# Patient Record
Sex: Male | Born: 1950
Health system: Southern US, Community
[De-identification: ages and names within clinical notes are randomized; demographics above are authoritative.]

## PROBLEM LIST (undated history)

## (undated) DIAGNOSIS — R0683 Snoring: Secondary | ICD-10-CM

## (undated) DIAGNOSIS — Z86010 Personal history of colonic polyps: Secondary | ICD-10-CM

## (undated) DIAGNOSIS — K219 Gastro-esophageal reflux disease without esophagitis: Secondary | ICD-10-CM

## (undated) DIAGNOSIS — Z860101 Personal history of adenomatous and serrated colon polyps: Secondary | ICD-10-CM

## (undated) DIAGNOSIS — Z87442 Personal history of urinary calculi: Secondary | ICD-10-CM

## (undated) DIAGNOSIS — E785 Hyperlipidemia, unspecified: Secondary | ICD-10-CM

## (undated) DIAGNOSIS — N471 Phimosis: Secondary | ICD-10-CM

## (undated) DIAGNOSIS — Z8709 Personal history of other diseases of the respiratory system: Secondary | ICD-10-CM

## (undated) DIAGNOSIS — Z973 Presence of spectacles and contact lenses: Secondary | ICD-10-CM

## (undated) DIAGNOSIS — B009 Herpesviral infection, unspecified: Secondary | ICD-10-CM

## (undated) HISTORY — PX: COLONOSCOPY: SHX174

## (undated) HISTORY — PX: INGUINAL HERNIA REPAIR: SUR1180

---

## 1990-11-02 HISTORY — PX: ARTHROSCOPIC REPAIR ACL: SUR80

## 2003-02-28 ENCOUNTER — Ambulatory Visit (HOSPITAL_COMMUNITY): Admission: RE | Admit: 2003-02-28 | Discharge: 2003-02-28 | Payer: Self-pay | Admitting: *Deleted

## 2003-02-28 ENCOUNTER — Encounter (INDEPENDENT_AMBULATORY_CARE_PROVIDER_SITE_OTHER): Payer: Self-pay | Admitting: Specialist

## 2004-03-03 ENCOUNTER — Ambulatory Visit (HOSPITAL_COMMUNITY): Admission: RE | Admit: 2004-03-03 | Discharge: 2004-03-03 | Payer: Self-pay | Admitting: Anesthesiology

## 2006-04-14 ENCOUNTER — Ambulatory Visit (HOSPITAL_COMMUNITY): Admission: RE | Admit: 2006-04-14 | Discharge: 2006-04-14 | Payer: Self-pay | Admitting: Orthopaedic Surgery

## 2006-04-22 ENCOUNTER — Ambulatory Visit (HOSPITAL_COMMUNITY): Admission: RE | Admit: 2006-04-22 | Discharge: 2006-04-22 | Payer: Self-pay | Admitting: Orthopaedic Surgery

## 2006-04-22 HISTORY — PX: KNEE ARTHROSCOPY: SHX127

## 2008-05-16 ENCOUNTER — Ambulatory Visit (HOSPITAL_COMMUNITY): Admission: RE | Admit: 2008-05-16 | Discharge: 2008-05-16 | Payer: Self-pay | Admitting: Neurological Surgery

## 2008-05-29 ENCOUNTER — Ambulatory Visit (HOSPITAL_COMMUNITY): Admission: RE | Admit: 2008-05-29 | Discharge: 2008-05-29 | Payer: Self-pay | Admitting: Neurological Surgery

## 2010-03-06 ENCOUNTER — Emergency Department (HOSPITAL_COMMUNITY): Admission: EM | Admit: 2010-03-06 | Discharge: 2010-03-06 | Payer: Self-pay | Admitting: Family Medicine

## 2011-03-20 NOTE — Op Note (Signed)
[name], [name]                ACCOUNT NO.:  [id number]   MEDICAL RECORD NO.:  [id number]          PATIENT TYPE:  AMB   LOCATION:  SDS                          FACILITY:  [facility]   PHYSICIAN:  Lubertha Basque. Dalldorf, M.D.DATE OF BIRTH:  1951-04-26   DATE OF PROCEDURE:  04/22/2006  DATE OF DISCHARGE:                                 OPERATIVE REPORT   PREOPERATIVE DIAGNOSIS:  1.  Left knee chondromalacia.  2.  Left knee torn medial meniscus.   POSTOPERATIVE DIAGNOSIS:  1.  Left knee chondromalacia.  2.  Left knee torn medial and torn lateral menisci.   PROCEDURES:  1.  Left knee arthroscopic partial medial and partial lateral      meniscectomies.  2.  Left knee arthroscopic chondroplasty and abrasion medial and      patellofemoral.   ANESTHESIA:  Knee block and MAC.   ATTENDING SURGEON:  Lubertha Basque. Jerl Santos, M.D.   ASSISTANT:  [name], P.A.-C.   INDICATIONS FOR PROCEDURE:  The patient is a 60 year old man who is many  years out from an ACL reconstruction.  Over recent years, he has had  increasing pain which has gotten much worse over the last couple of months.  This has become resistant to oral anti-inflammatories and even an injection  which helped for about a day or two.  He has pain which limits his ability  to rest and to walk and squat and do his job.  He had undergone a  preoperative MRI scan which shows some degenerative changes also something  suspicious for meniscus tear.  He also has a large area of scar tissue in  the anterior portion of the knee.  He is offered an arthroscopy.  Informed  operative consent was obtained after a discussion about the possible  complications of reaction to anesthesia and infection.   SUMMARY AND FINDINGS:  Under knee block and MAC through two portals, an  arthroscopy of the left knee was performed.  In the suprapatellar pouch, he  had some articular loose bodies which were removed.  The patellofemoral  joint tracked well but he  had grade 3 and 4 change on both aspects.  A  thorough chondroplasty was done of both the patella and intertrochlear  groove with abrasion to bleeding bone of some small areas of the  intertrochlear groove.  In the medial compartment, he had a small posterior  horn medial meniscus tear addressed with about a 5% partial meniscectomy  back to stable tissues.  Of greater significance was an area about the size  of two quarters which was devoid of articular cartilage.  This was on the  medial femoral condyle on the weight bearing portion.  He had some large  flaps of articular cartilage which I addressed with a chondroplasty and then  did an abrasion to bleeding bone in some areas but not a formal  microfracture as the area seemed too large.  In the lateral compartment, he  had no articular cartilage damage but did have a displaced lateral meniscus  tear which was flipped into the notch.  About a  20% partial lateral  meniscectomy was done with the shaver and basket.  In the notch, he had a  large ball of scar tissue stuck in the anterior position consistent with a  cyclops lesion and this was removed revealing an intact ACL reconstruction  underneath.   DESCRIPTION OF PROCEDURE:  The patient was taken to the operating suite  where knee block and MAC were applied.  He was positioned supine and prepped  and draped in a normal sterile fashion.  After the administration of IV  Kefzol, an arthroscopy of the left knee was performed through two portals.  Findings were as noted above.  The procedure consisted of a 5% partial  medial meniscectomy and about a 20% lateral meniscectomy.  As mentioned  above, we performed chondroplasty in the medial and patellofemoral  compartments with abrasion to bleeding bone in several small areas.  An  abundance of interarticular loose bodies were removed most of which seemed  to be articular cartilage in nature.  The large cyclops lesion was removed  revealing an  intact ACL underneath.  The knee was thoroughly irrigated at  the end of the case followed by placement of Marcaine with epinephrine and  morphine plus 80 mg Depo-Medrol.  Adaptic was applied to the portals  followed by dry gauze and loose Ace wrap.  Estimated blood loss and  interoperative fluids can be obtained from anesthesia records.   DISPOSITION:  The patient was taken to the recovery room in stable addition.   PLAN:  He was to go home same day and to follow up in the office in a week.  I will contact him by phone tonight.      Lubertha Basque Jerl Santos, M.D.  Electronically Signed     PGD/MEDQ  D:  04/22/2006  T:  04/22/2006  Job:  [id number]

## 2011-03-20 NOTE — Op Note (Signed)
   NAME:  [name], [name]                          ACCOUNT NO.:  [id number]   MEDICAL RECORD NO.:  [id number]                   PATIENT TYPE:  AMB   LOCATION:  ENDO                                 FACILITY:  [facility]   PHYSICIAN:  Georgiana Spinner, M.D.                 DATE OF BIRTH:  06-11-1951   DATE OF PROCEDURE:  02/28/2003  DATE OF DISCHARGE:                                 OPERATIVE REPORT   PROCEDURE:  Colonoscopy.   INDICATIONS:  Colon polyps.   ANESTHESIA:  Fentanyl 50 mcg, Versed 2.5 mg, propofol 300 mg.   DESCRIPTION OF PROCEDURE:  With the patient mildly sedated in the left  lateral decubitus position, the Olympus videoscopic colonoscope was inserted  in the rectum and passed under direct vision to the cecum, identified by  ileocecal valve and appendiceal orifice.  We entered into the terminal  ileum, which also appeared normal and was photographed.  From this point the  colonoscope was slowly withdrawn, taking circumferential views of the entire  colonic mucosa, stopping in the rectum where a small polyp was seen,  photographed, and removed using hot biopsy forceps technique, setting of 20-  20 blended current.  Just distal to this in the rectum was seen a larger,  approximately 1 cm lobulated polyp.  It too was photographed, and it was  removed using snare cautery technique, setting again of 20-20 blended  current.  The residual area was touched with a probe and electric current  given to cauterize any residual tissue.  The endoscope was placed in  retroflexion to view the anal canal from above.  Hemorrhoids were seen.  The  endoscope was straightened and withdrawn.  The patient's vital signs and  pulse oximetry remained stable.  The patient tolerated the procedure well  without apparent complications.   FINDINGS:  1. Polyps of rectum as described above, removed.  Await biopsy report.  The     patient will call me for results and follow up with me as an outpatient.  2.  Internal hemorrhoids were seen as well.                                               Georgiana Spinner, M.D.    GMO/MEDQ  D:  02/28/2003  T:  02/28/2003  Job:  [id number]

## 2011-05-29 ENCOUNTER — Other Ambulatory Visit: Payer: Self-pay | Admitting: Gastroenterology

## 2012-01-27 ENCOUNTER — Encounter (HOSPITAL_COMMUNITY): Payer: Self-pay | Admitting: *Deleted

## 2012-01-27 ENCOUNTER — Emergency Department (INDEPENDENT_AMBULATORY_CARE_PROVIDER_SITE_OTHER)
Admission: EM | Admit: 2012-01-27 | Discharge: 2012-01-27 | Disposition: A | Discharge status: Home / Self Care | Payer: 59 | Source: Home / Self Care | Attending: Family Medicine | Admitting: Family Medicine

## 2012-01-27 DIAGNOSIS — R059 Cough, unspecified: Secondary | ICD-10-CM

## 2012-01-27 DIAGNOSIS — R05 Cough: Secondary | ICD-10-CM

## 2012-01-27 LAB — POCT RAPID STREP A: Streptococcus, Group A Screen (Direct): NEGATIVE

## 2012-01-27 MED ORDER — OMEPRAZOLE 20 MG PO CPDR: DELAYED_RELEASE_CAPSULE | ORAL | Status: DC

## 2012-01-27 MED ORDER — PREDNISONE 20 MG PO TABS: ORAL_TABLET | ORAL | Status: AC

## 2012-01-27 NOTE — Discharge Instructions (Signed)
Persistent cough can be a sign of different conditions (see handout for more information ) My impression is that you likely have gastroesophageal reflux versus seasonal allergies or both causing irritation and swelling of your upper airways. Which can explain your symptoms of cough, voice hoarseness, stridor etc. Can follow antireflux measures like elevating the head side of your bed and avoiding spicy food and caffeine etc. Take the prescribed medications as instructed. Use steam water (vaporizer) overnight at bedside. Can take over-the-counter Zyrtec 10 mg or Allegra 180 mg daily. Followup with your primary care provider if persistent symptoms as you may need to be referred to a lung specia for lung functional test. Return or go to the emergency department if worsening symptoms like difficulty breathing new onset of fever or any other concern.

## 2012-01-27 NOTE — ED Provider Notes (Signed)
History     CSN: [id number]  Arrival date & time 01/27/12  1605   First MD Initiated Contact with Patient 01/27/12 1711      Chief Complaint  Patient presents with  . Cough    (Consider location/radiation/quality/duration/timing/severity/associated sxs/prior treatment) HPI Comments: 61 y/o male non smoker no significant PMH here c/o cough non minimally productive of clear sputum for 10 days. Describes a tickle sensation in upper airways when laying flat which  triggers the cough spells. Cough is not always productive. Denies chest pain or shortness of breath but has noticed inspiratory stridor sporadically. No headache, fever, chills or malaise. No sore throat, nasal congestion or rhinorrhea. Denies prior cold symptoms. 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 No known acid reflux. No prior history of asthma but has used a inhaler with some symptoms relief. Denies PND, leg swelling. Mild hoarseness.    Past Medical History  Diagnosis Date  . Bronchitis     Past Surgical History  Procedure Date  . Knee surgery     History reviewed. No pertinent family history.  History  Substance Use Topics  . Smoking status: Never Smoker   . Smokeless tobacco: Not on file  . Alcohol Use: No      Review of Systems  Constitutional: Negative for fever, chills, appetite change, fatigue and unexpected weight change.  HENT: Negative for ear pain, congestion, sore throat, rhinorrhea, trouble swallowing and sinus pressure.   Eyes: Negative for discharge and itching.  Respiratory: Positive for cough and stridor. Negative for chest tightness, shortness of breath and wheezing.   Cardiovascular: Negative for chest pain, palpitations and leg swelling.  Gastrointestinal: Negative for nausea, vomiting, abdominal pain and diarrhea.  Musculoskeletal: Negative for myalgias and arthralgias.  Skin: Negative for rash.  Neurological: Negative for dizziness and headaches.    Allergies   Codeine  Home Medications   Current Outpatient Rx  Name Route Sig Dispense Refill  . ALBUTEROL SULFATE HFA 108 (90 BASE) MCG/ACT IN AERS Inhalation Inhale 2 puffs into the lungs every 6 (six) hours as needed.    Marland Kitchen OVER THE COUNTER MEDICATION  Cough medication    . OMEPRAZOLE 20 MG PO CPDR  Take 2 tabs bid for 7 days then 1 tab po daily 30 capsule 0  . PREDNISONE 20 MG PO TABS  2 tabs po daily for 5 days 10 tablet no    BP 131/89  Pulse 64  Temp(Src) 98.5 F (36.9 C) (Oral)  Resp 16  SpO2 97%  Physical Exam  Nursing note and vitals reviewed. Constitutional: He is oriented to person, place, and time. He appears well-developed and well-nourished. No distress.       overweight.   HENT:  Head: Normocephalic and atraumatic.  Right Ear: External ear normal.  Left Ear: External ear normal.  Nose: Nose normal.  Mouth/Throat: Oropharynx is clear and moist. No oropharyngeal exudate.       Mild pharyngeal e erythema with no postnasal drip or exudates.  Eyes: Conjunctivae are normal. Right eye exhibits no discharge. Left eye exhibits no discharge.  Neck: Neck supple. No JVD present. No thyromegaly present.  Cardiovascular: Normal rate, regular rhythm and normal heart sounds.  Exam reveals no gallop and no friction rub.   No murmur heard. Pulmonary/Chest: Effort normal and breath sounds normal. No respiratory distress. He has no wheezes. He has no rales. He exhibits no tenderness.       Impress sporadic mild upper airways inspiratory stridor  and mild hoarseness.  Lymphadenopathy:    He has no cervical adenopathy.  Neurological: He is alert and oriented to person, place, and time.  Skin: No rash noted. He is not diaphoretic.    ED Course  Procedures (including critical care time)   Labs Reviewed  POCT RAPID STREP A ([facility] CARE ONLY)  LAB REPORT - SCANNED   No results found.   1. Cough       MDM  Negative strep. Impress irritation of upper airways possible non infectious  laryngotracheitis likely related to seasonal allergies or exposure to dry cold air in OR. Possible GERD contributing. Prescribed prednisone for 5 days. Asked to take over the counter zyrtec or allegra, saline spray,  Prilosec 40mg  bid for 1 week then take 20mg  daily. Antireflux measures and humid air (vaporizer) overnight. continue inhaler.         Sharin Grave, MD 01/28/12 1210

## 2012-01-27 NOTE — ED Notes (Signed)
Pt with c/o dry cough x 10 days - unable to sleep at night due to coughing when lying flat

## 2012-01-27 NOTE — ED Notes (Signed)
Walked pt in unit spo2 96 to 97%

## 2012-08-15 ENCOUNTER — Encounter (HOSPITAL_BASED_OUTPATIENT_CLINIC_OR_DEPARTMENT_OTHER): Payer: Self-pay | Admitting: *Deleted

## 2012-08-15 NOTE — Progress Notes (Signed)
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

## 2012-08-19 ENCOUNTER — Ambulatory Visit (HOSPITAL_BASED_OUTPATIENT_CLINIC_OR_DEPARTMENT_OTHER): Payer: 59 | Admitting: Anesthesiology

## 2012-08-19 ENCOUNTER — Encounter (HOSPITAL_BASED_OUTPATIENT_CLINIC_OR_DEPARTMENT_OTHER): Payer: Self-pay

## 2012-08-19 ENCOUNTER — Encounter (HOSPITAL_BASED_OUTPATIENT_CLINIC_OR_DEPARTMENT_OTHER): Payer: Self-pay | Admitting: Anesthesiology

## 2012-08-19 ENCOUNTER — Encounter (HOSPITAL_BASED_OUTPATIENT_CLINIC_OR_DEPARTMENT_OTHER)
Admission: RE | Disposition: A | Discharge status: Home / Self Care | Payer: Self-pay | Source: Ambulatory Visit | Attending: Otolaryngology

## 2012-08-19 ENCOUNTER — Ambulatory Visit (HOSPITAL_BASED_OUTPATIENT_CLINIC_OR_DEPARTMENT_OTHER)
Admission: RE | Admit: 2012-08-19 | Discharge: 2012-08-19 | Disposition: A | Discharge status: Home / Self Care | Payer: 59 | Source: Ambulatory Visit | Attending: Otolaryngology | Admitting: Otolaryngology

## 2012-08-19 DIAGNOSIS — L723 Sebaceous cyst: Secondary | ICD-10-CM | POA: Insufficient documentation

## 2012-08-19 DIAGNOSIS — E785 Hyperlipidemia, unspecified: Secondary | ICD-10-CM | POA: Insufficient documentation

## 2012-08-19 DIAGNOSIS — L089 Local infection of the skin and subcutaneous tissue, unspecified: Secondary | ICD-10-CM | POA: Diagnosis present

## 2012-08-19 HISTORY — PX: EAR CYST EXCISION: SHX22

## 2012-08-19 SURGERY — CYST REMOVAL
Anesthesia: General | Site: Ear | Laterality: Left | Wound class: Clean

## 2012-08-19 MED ORDER — OXYCODONE HCL 5 MG/5ML PO SOLN: 5.0000 mg | Freq: Once | ORAL | Status: DC | PRN

## 2012-08-19 MED ORDER — OXYCODONE HCL 5 MG PO TABS: 5.0000 mg | ORAL_TABLET | Freq: Once | ORAL | Status: DC | PRN

## 2012-08-19 MED ORDER — MIDAZOLAM HCL 5 MG/5ML IJ SOLN
INTRAMUSCULAR | Status: DC | PRN
  Administered 2012-08-19: 2 mg via INTRAVENOUS

## 2012-08-19 MED ORDER — LIDOCAINE-EPINEPHRINE 1 %-1:100000 IJ SOLN
INTRAMUSCULAR | Status: DC | PRN
  Administered 2012-08-19: 1 mL

## 2012-08-19 MED ORDER — LACTATED RINGERS IV SOLN
INTRAVENOUS | Status: DC
  Administered 2012-08-19 (×2): via INTRAVENOUS

## 2012-08-19 MED ORDER — PROMETHAZINE HCL 25 MG/ML IJ SOLN: 6.2500 mg | INTRAMUSCULAR | Status: DC | PRN

## 2012-08-19 MED ORDER — MIDAZOLAM HCL 2 MG/2ML IJ SOLN: 0.5000 mg | Freq: Once | INTRAMUSCULAR | Status: DC | PRN

## 2012-08-19 MED ORDER — DEXAMETHASONE SODIUM PHOSPHATE 4 MG/ML IJ SOLN
INTRAMUSCULAR | Status: DC | PRN
  Administered 2012-08-19: 10 mg via INTRAVENOUS

## 2012-08-19 MED ORDER — MEPERIDINE HCL 25 MG/ML IJ SOLN: 6.2500 mg | INTRAMUSCULAR | Status: DC | PRN

## 2012-08-19 MED ORDER — LIDOCAINE HCL (CARDIAC) 20 MG/ML IV SOLN
INTRAVENOUS | Status: DC | PRN
  Administered 2012-08-19: 30 mg via INTRAVENOUS

## 2012-08-19 MED ORDER — FENTANYL CITRATE 0.05 MG/ML IJ SOLN
INTRAMUSCULAR | Status: DC | PRN
  Administered 2012-08-19: 100 ug via INTRAVENOUS

## 2012-08-19 MED ORDER — AMOXICILLIN-POT CLAVULANATE 500-125 MG PO TABS: 1.0000 | ORAL_TABLET | Freq: Two times a day (BID) | ORAL | Status: DC

## 2012-08-19 MED ORDER — FENTANYL CITRATE 0.05 MG/ML IJ SOLN: 25.0000 ug | INTRAMUSCULAR | Status: DC | PRN

## 2012-08-19 MED ORDER — CEFAZOLIN SODIUM-DEXTROSE 2-3 GM-% IV SOLR
INTRAVENOUS | Status: DC | PRN
  Administered 2012-08-19: 2 g via INTRAVENOUS

## 2012-08-19 MED ORDER — PROPOFOL 10 MG/ML IV BOLUS
INTRAVENOUS | Status: DC | PRN
  Administered 2012-08-19: 200 mg via INTRAVENOUS

## 2012-08-19 MED ORDER — ONDANSETRON HCL 4 MG/2ML IJ SOLN
INTRAMUSCULAR | Status: DC | PRN
  Administered 2012-08-19: 4 mg via INTRAVENOUS

## 2012-08-19 SURGICAL SUPPLY — 54 items
ADH SKN CLS APL DERMABOND .7 (GAUZE/BANDAGES/DRESSINGS) ×1
APL SKNCLS STERI-STRIP NONHPOA (GAUZE/BANDAGES/DRESSINGS)
ATTRACTOMAT 16X20 MAGNETIC DRP (DRAPES) IMPLANT
BENZOIN TINCTURE PRP APPL 2/3 (GAUZE/BANDAGES/DRESSINGS) IMPLANT
BLADE SURG 15 STRL LF DISP TIS (BLADE) ×1 IMPLANT
BLADE SURG 15 STRL SS (BLADE) ×2
CANISTER SUCTION 1200CC (MISCELLANEOUS) ×2 IMPLANT
CLEANER CAUTERY TIP 5X5 PAD (MISCELLANEOUS) IMPLANT
CLOTH BEACON ORANGE TIMEOUT ST (SAFETY) ×2 IMPLANT
CORDS BIPOLAR (ELECTRODE) IMPLANT
COVER MAYO STAND STRL (DRAPES) ×2 IMPLANT
COVER TABLE BACK 60X90 (DRAPES) ×2 IMPLANT
DECANTER SPIKE VIAL GLASS SM (MISCELLANEOUS) IMPLANT
DERMABOND ADVANCED (GAUZE/BANDAGES/DRESSINGS) ×1
DERMABOND ADVANCED .7 DNX12 (GAUZE/BANDAGES/DRESSINGS) IMPLANT
DRAIN PENROSE 1/4X12 LTX STRL (WOUND CARE) IMPLANT
DRAPE U-SHAPE 76X120 STRL (DRAPES) ×2 IMPLANT
ELECT COATED BLADE 2.86 ST (ELECTRODE) ×2 IMPLANT
ELECT NDL BLADE 2-5/6 (NEEDLE) IMPLANT
ELECT NEEDLE BLADE 2-5/6 (NEEDLE) ×2 IMPLANT
ELECT REM PT RETURN 9FT ADLT (ELECTROSURGICAL) ×2
ELECTRODE REM PT RTRN 9FT ADLT (ELECTROSURGICAL) ×1 IMPLANT
GAUZE SPONGE 4X4 12PLY STRL LF (GAUZE/BANDAGES/DRESSINGS) IMPLANT
GAUZE SPONGE 4X4 16PLY XRAY LF (GAUZE/BANDAGES/DRESSINGS) IMPLANT
GLOVE BIO SURGEON STRL SZ 6.5 (GLOVE) ×1 IMPLANT
GLOVE BIOGEL M 7.0 STRL (GLOVE) ×2 IMPLANT
GLOVE SKINSENSE NS SZ7.0 (GLOVE) ×1
GLOVE SKINSENSE STRL SZ7.0 (GLOVE) IMPLANT
GOWN PREVENTION PLUS XLARGE (GOWN DISPOSABLE) ×2 IMPLANT
NEEDLE 27GAX1X1/2 (NEEDLE) ×2 IMPLANT
NS IRRIG 1000ML POUR BTL (IV SOLUTION) ×2 IMPLANT
PACK BASIN DAY SURGERY FS (CUSTOM PROCEDURE TRAY) ×2 IMPLANT
PAD CLEANER CAUTERY TIP 5X5 (MISCELLANEOUS)
PENCIL BUTTON HOLSTER BLD 10FT (ELECTRODE) ×2 IMPLANT
RUBBERBAND STERILE (MISCELLANEOUS) ×1 IMPLANT
SHEET MEDIUM DRAPE 40X70 STRL (DRAPES) IMPLANT
SPONGE GAUZE 2X2 8PLY STRL LF (GAUZE/BANDAGES/DRESSINGS) ×2 IMPLANT
STRIP CLOSURE SKIN 1/4X4 (GAUZE/BANDAGES/DRESSINGS) IMPLANT
SUT ETHILON 4 0 PS 2 18 (SUTURE) ×1 IMPLANT
SUT ETHILON 5 0 P 3 18 (SUTURE)
SUT NOVAFIL 5 0 BLK 18 IN P13 (SUTURE) IMPLANT
SUT NYLON ETHILON 5-0 P-3 1X18 (SUTURE) IMPLANT
SUT VIC AB 3-0 FS2 27 (SUTURE) IMPLANT
SUT VIC AB 4-0 RB1 27 (SUTURE)
SUT VIC AB 4-0 RB1 27X BRD (SUTURE) IMPLANT
SUT VICRYL 4-0 PS2 18IN ABS (SUTURE) ×1 IMPLANT
SWAB CULTURE LIQ STUART DBL (MISCELLANEOUS) IMPLANT
SYR BULB 3OZ (MISCELLANEOUS) ×1 IMPLANT
SYR CONTROL 10ML LL (SYRINGE) ×2 IMPLANT
TOWEL OR 17X24 6PK STRL BLUE (TOWEL DISPOSABLE) ×3 IMPLANT
TRAY DSU PREP LF (CUSTOM PROCEDURE TRAY) ×2 IMPLANT
TUBE ANAEROBIC SPECIMEN COL (MISCELLANEOUS) IMPLANT
TUBE CONNECTING 20X1/4 (TUBING) ×2 IMPLANT
WATER STERILE IRR 1000ML POUR (IV SOLUTION) ×1 IMPLANT

## 2012-08-19 NOTE — Brief Op Note (Signed)
08/19/2012  8:39 AM  PATIENT:  [name]  61 y.o. male  PRE-OPERATIVE DIAGNOSIS:  infra auricular cyst  POST-OPERATIVE DIAGNOSIS:  infra auricular cyst  PROCEDURE:  Procedure(s) (LRB) with comments: CYST REMOVAL (Left) - excision of left  infra auricular cyst   SURGEON:  Surgeon(s) and Role:    * Osborn Coho, MD - Primary  PHYSICIAN ASSISTANT:   ASSISTANTS: none   ANESTHESIA:   general  EBL:  Total I/O In: 1000 [I.V.:1000] Out: - Min  BLOOD ADMINISTERED:none  DRAINS: Penrose drain in the incision   LOCAL MEDICATIONS USED:  LIDOCAINE  and Amount: 1 ml  SPECIMEN:  Source of Specimen:  Left facial cyst  DISPOSITION OF SPECIMEN:  PATHOLOGY  COUNTS:  YES  TOURNIQUET:  * No tourniquets in log *  DICTATION: .Other Dictation: Dictation Number [id number]  PLAN OF CARE: Discharge to home after PACU  PATIENT DISPOSITION:  PACU - hemodynamically stable.   Delay start of Pharmacological VTE agent (>24hrs) due to surgical blood loss or risk of bleeding: not applicable

## 2012-08-19 NOTE — Transfer of Care (Signed)
Immediate Anesthesia Transfer of Care Note  Patient: [name]  Procedure(s) Performed: Procedure(s) (LRB) with comments: CYST REMOVAL (Left) - excision of left  infra auricular cyst   Patient Location: PACU  Anesthesia Type: General  Level of Consciousness: awake  Airway & Oxygen Therapy: Patient Spontanous Breathing and Patient connected to face mask oxygen  Post-op Assessment: Report given to PACU RN and Post -op Vital signs reviewed and stable  Post vital signs: Reviewed and stable  Complications: No apparent anesthesia complications

## 2012-08-19 NOTE — H&P (Signed)
[name] is an 61 y.o. male.   Chief Complaint: Left Infra-auricular cyst HPI: Recurrent infections Lt cyst  Past Medical History  Diagnosis Date  . Bronchitis   . Hyperlipemia   . Low testosterone   . Bronchitis     history    Past Surgical History  Procedure Date  . Knee surgery   . Arthroscopic repair acl 1992    left knee  . Knee arthroscopy     left  . Inguinal hernia repair     right  . Colonoscopy     History reviewed. No pertinent family history. Social History:  reports that he has never smoked. He does not have any smokeless tobacco history on file. He reports that he does not drink alcohol or use illicit drugs.  Allergies:  Allergies  Allergen Reactions  . Codeine     Medications Prior to Admission  Medication Sig Dispense Refill  . simvastatin (ZOCOR) 40 MG tablet Take 40 mg by mouth every evening.      . testosterone (ANDROGEL) 50 MG/5GM GEL Place 5 g onto the skin daily. Uses the under arm tx axeron      . albuterol (PROVENTIL HFA;VENTOLIN HFA) 108 (90 BASE) MCG/ACT inhaler Inhale 2 puffs into the lungs every 6 (six) hours as needed.        No results found for this or any previous visit (from the past 48 hour(s)). No results found.  Review of Systems  Constitutional: Negative.   Respiratory: Negative.   Cardiovascular: Negative.   Gastrointestinal: Negative.   Musculoskeletal: Negative.   Skin: Negative.     Blood pressure 113/75, pulse 57, temperature 97.7 F (36.5 C), temperature source Oral, resp. rate 20, height 5\' 9"  (1.753 m), weight 102.422 kg (225 lb 12.8 oz), SpO2 96.00%. Physical Exam  Constitutional: He is oriented to person, place, and time. He appears well-developed and well-nourished.  Neck: Normal range of motion. Neck supple.  Cardiovascular: Normal rate and regular rhythm.   Respiratory: Effort normal.  GI: Soft.  Musculoskeletal: Normal range of motion.  Neurological: He is alert and oriented to person, place, and  time.     Assessment/Plan OP exc under GA, d/c to home postop  [name] 08/19/2012, 7:32 AM

## 2012-08-19 NOTE — Anesthesia Procedure Notes (Signed)
Procedure Name: LMA Insertion Performed by: Violet Cart, [place] Pre-anesthesia Checklist: Patient identified, Timeout performed, Emergency Drugs available, Suction available and Patient being monitored Patient Re-evaluated:Patient Re-evaluated prior to inductionOxygen Delivery Method: Circle system utilized Preoxygenation: Pre-oxygenation with 100% oxygen Intubation Type: IV induction Ventilation: Mask ventilation without difficulty LMA: LMA inserted LMA Size: 5.0 Number of attempts: 1 Placement Confirmation: breath sounds checked- equal and bilateral and positive ETCO2 Tube secured with: Tape Dental Injury: Teeth and Oropharynx as per pre-operative assessment      

## 2012-08-19 NOTE — Anesthesia Postprocedure Evaluation (Signed)
  Anesthesia Post-op Note  Patient: [name]  Procedure(s) Performed: Procedure(s) (LRB) with comments: CYST REMOVAL (Left) - excision of left  infra auricular cyst   Patient Location: PACU  Anesthesia Type: General  Level of Consciousness: awake, alert , oriented and patient cooperative  Airway and Oxygen Therapy: Patient Spontanous Breathing  Post-op Pain: none  Post-op Assessment: Post-op Vital signs reviewed, Patient's Cardiovascular Status Stable, Respiratory Function Stable, Patent Airway, No signs of Nausea or vomiting and Pain level controlled  Post-op Vital Signs: Reviewed and stable  Complications: No apparent anesthesia complications

## 2012-08-19 NOTE — Anesthesia Preprocedure Evaluation (Signed)
Anesthesia Evaluation  Patient identified by MRN, date of birth, ID band Patient awake    Reviewed: Allergy & Precautions, H&P , NPO status , Patient's Chart, lab work & pertinent test results  History of Anesthesia Complications Negative for: history of anesthetic complications  Airway Mallampati: I TM Distance: >3 FB Neck ROM: Full    Dental  (+) Teeth Intact and Dental Advisory Given   Pulmonary neg pulmonary ROS,  breath sounds clear to auscultation  Pulmonary exam normal       Cardiovascular negative cardio ROS  Rhythm:Regular Rate:Normal     Neuro/Psych negative neurological ROS     GI/Hepatic negative GI ROS, Neg liver ROS,   Endo/Other  negative endocrine ROS  Renal/GU negative Renal ROS     Musculoskeletal   Abdominal (+) + obese,   Peds  Hematology   Anesthesia Other Findings   Reproductive/Obstetrics                           Anesthesia Physical Anesthesia Plan  ASA: II  Anesthesia Plan: General   Post-op Pain Management:    Induction: Intravenous  Airway Management Planned: LMA  Additional Equipment:   Intra-op Plan:   Post-operative Plan:   Informed Consent: I have reviewed the patients History and Physical, chart, labs and discussed the procedure including the risks, benefits and alternatives for the proposed anesthesia with the patient or authorized representative who has indicated his/her understanding and acceptance.   Dental advisory given  Plan Discussed with: CRNA and Surgeon  Anesthesia Plan Comments: (Plan routine monitors, GA- LMA OK)        Anesthesia Quick Evaluation

## 2012-08-22 ENCOUNTER — Encounter (HOSPITAL_BASED_OUTPATIENT_CLINIC_OR_DEPARTMENT_OTHER): Payer: Self-pay | Admitting: Otolaryngology

## 2012-08-22 NOTE — Op Note (Signed)
[name], [name]                ACCOUNT NO.:  [id number]  MEDICAL RECORD NO.:  [id number]  LOCATION:                               FACILITY:  [facility]  PHYSICIAN:  Kinnie Scales. Annalee Genta, M.D.    DATE OF BIRTH:  DATE OF PROCEDURE:  08/19/2012 DATE OF DISCHARGE:  08/19/2012                              OPERATIVE REPORT   PREOPERATIVE DIAGNOSIS:  Left infra-auricular sebaceous cyst and previous abscess.  POSTOPERATIVE DIAGNOSIS:  Left infra-auricular sebaceous cyst and previous abscess.  SURGICAL PROCEDURE:  Wide local excision of infra-auricular sebaceous cyst with complex reconstruction (4 cm).  ANESTHESIA:  General endotracheal.  SURGEON:  Kinnie Scales. Annalee Genta, MD  COMPLICATIONS:  None.  ESTIMATED BLOOD LOSS:  Minimal.  The patient transferred from the operating room to recovery room in stable condition.  BRIEF HISTORY:  The patient is a 61 year old white male with a history of recurrent infection involving a cutaneous mass in the infraauricular space.  The patient has had multiple episodes of infection requiring antibiotic therapy and when initially evaluated had an approximately 2 cm swollen erythematous mass in the infraauricular region.  After appropriate antibiotic therapy, the infection resolved and the patient was found to have residual approximately 1 cm subcutaneous mass extending to the periparotid fascia.  Given the patient's history, examination, and physical findings, I recommended wide local excision with reconstruction.  The risks and benefits of the procedure were discussed in detail and the patient understood and concurred with our plan for surgery which is scheduled on elective basis at [facility] on August 19, 2012.  PROCEDURE:  The patient was brought to the operating room and placed in supine position on the operating table.  General LMA anesthesia was established without difficulty.  When the patient adequately anesthetized, he was positioned on  the operating table and prepped and draped in a sterile fashion.  He was then injected with 1 mL of 1% lidocaine 1:100,000 solution of epinephrine in the proposed skin incision.  After allowing adequate time for vasoconstriction and hemostasis, the patient prepped and draped and prepared for surgery.  A curvilinear incision was created in the patient's preauricular and infra- auricular skin crease, extending around the earlobe.  An elliptical incision was extended anteriorly and inferiorly to incorporate the cutaneous component of the sebaceous mass.  Dissection was then carried through the skin and underlying subcutaneous tissue with a #15 scalpel blade.  Needle cautery was then used for hemostatic dissection of the mass, which on complete resection measuring approximately 1 x 1 cm with a overlying strip of skin and subcutaneous tissue.  Dissection was carried inferiorly and anteriorly to the level of the periparotid fascia which was taken at the deep component of our dissection because of previous infection transecting a portion of the tail of the parotid gland along with the surgical specimen.  This was sent to Pathology for gross microscopic evaluation.  The patient's wound was thoroughly irrigated.  There was no evidence of active infection or sebaceous material.  No evidence of residual cyst.  Bovie electrocautery was then used to maintain hemostasis.  The patient's wound was closed in multiple layers with rotation and advancement of the  inferior and posterior periparotid skin and subcutaneous tissue to close the wound defect and bring the wound edges to a good approximation.  This was accomplished with intermittent 4-0 Vicryl suture in interrupted fashion.  A rubber band drain was placed at the depth of the surgical dissection and sutured to the skin in the posterior auricular region with a 3-0 Ethilon suture.  Immediate subcutaneous closure was then undertaken with interrupted 4-0  Vicryl suture and final skin edge closure with Dermabond surgical glue.  The wound was dressed with a 2 x 2 gauze, no ointment. The patient was awakened from his anesthetic and transferred from the operating room to recovery room in stable condition.  No complications. Estimated blood loss was minimal.          ______________________________ Kinnie Scales. Annalee Genta, M.D.     DLS/MEDQ  D:  16/08/9603  T:  08/20/2012  Job:  [id number]

## 2012-12-17 ENCOUNTER — Other Ambulatory Visit: Payer: Self-pay

## 2013-09-07 ENCOUNTER — Other Ambulatory Visit: Payer: Self-pay

## 2014-01-15 ENCOUNTER — Emergency Department (HOSPITAL_COMMUNITY)
Admission: EM | Admit: 2014-01-15 | Discharge: 2014-01-15 | Disposition: A | Discharge status: Home / Self Care | Payer: 59 | Source: Home / Self Care | Attending: Family Medicine | Admitting: Family Medicine

## 2014-01-15 ENCOUNTER — Encounter (HOSPITAL_COMMUNITY): Payer: Self-pay | Admitting: Emergency Medicine

## 2014-01-15 ENCOUNTER — Emergency Department (INDEPENDENT_AMBULATORY_CARE_PROVIDER_SITE_OTHER): Payer: 59

## 2014-01-15 DIAGNOSIS — J690 Pneumonitis due to inhalation of food and vomit: Secondary | ICD-10-CM

## 2014-01-15 MED ORDER — AMOXICILLIN-POT CLAVULANATE 875-125 MG PO TABS: 1.0000 | ORAL_TABLET | Freq: Two times a day (BID) | ORAL | Status: DC

## 2014-01-15 MED ORDER — PREDNISONE 50 MG PO TABS: 50.0000 mg | ORAL_TABLET | Freq: Every day | ORAL | Status: DC

## 2014-01-15 MED ORDER — IPRATROPIUM-ALBUTEROL 0.5-2.5 (3) MG/3ML IN SOLN
RESPIRATORY_TRACT | Status: AC
  Filled 2014-01-15: qty 3

## 2014-01-15 MED ORDER — METHYLPREDNISOLONE SODIUM SUCC 125 MG IJ SOLR
125.0000 mg | Freq: Once | INTRAMUSCULAR | Status: AC
  Administered 2014-01-15: 125 mg via INTRAMUSCULAR

## 2014-01-15 MED ORDER — IPRATROPIUM-ALBUTEROL 0.5-2.5 (3) MG/3ML IN SOLN
3.0000 mL | RESPIRATORY_TRACT | Status: DC
  Administered 2014-01-15: 3 mL via RESPIRATORY_TRACT

## 2014-01-15 MED ORDER — METHYLPREDNISOLONE SODIUM SUCC 125 MG IJ SOLR
INTRAMUSCULAR | Status: AC
  Filled 2014-01-15: qty 2

## 2014-01-15 NOTE — ED Notes (Signed)
Pt reports having flare up of acid reflux Saturday morning.  "feels like i aspirated".   Having SOB.  No relief with using inhaler.  Hx GERD.  Denies any other symptoms.

## 2014-01-15 NOTE — Discharge Instructions (Signed)
Aspiration Pneumonia °Aspiration pneumonia is an infection in your lungs. It occurs when food, liquid, or stomach contents (vomit) are inhaled (aspirated) into your lungs. When these things get into your lungs, swelling (inflammation) and infection can occur. This can make it difficult for you to breathe. Aspiration pneumonia is a serious condition and can be life threatening. °RISK FACTORS °Aspiration pneumonia is more likely to occur when a person's cough (gag) reflex or ability to swallow has been decreased. Some things that can do this include:  °· Having a brain injury or disease, such as stroke, seizures, Parkinson disease, dementia, or amyotrophic lateral sclerosis (ALS).   °· Being given general anesthetic for procedures.   °· Being in a coma (unconscious).   °· Having a narrowing of the tube that carries food to the stomach (esophagus).   °· Drinking too much alcohol. If a person passes out and vomits, vomit can be swallowed into the lungs.   °· Taking certain medicines, such as tranquilizers or sedatives.   °SIGNS AND SYMPTOMS  °· Coughing after swallowing food or liquids.   °· Breathing problems, such as wheezing or shortness of breath.   °· Bluish skin. This can be caused by lack of oxygen.   °· Coughing up food or mucus. The mucus might contain blood, greenish material, or yellowish-white fluid (pus).   °· Fever.   °· Chest pain.   °· Being more tired than usual (fatigue).   °· Sweating more than usual.   °· Bad breath.   °DIAGNOSIS  °A physical exam will be done. During the exam, the health care provider will listen to your lungs with a stethoscope to check for:  °· Crackling sounds in the lungs. °· Decreased breath sounds. °· A rapid heartbeat. °Various tests may be ordered. These may include:  °· Chest X-ray.   °· CT scan.   °· Swallowing study. This test looks at how food is swallowed and whether it goes into your breathing tube (trachea) or food pipe (esophagus).   °· Sputum culture. Saliva and  mucus (sputum) are collected from the lungs or the tubes that carry air to the lungs (bronchi). The sputum is then tested for bacteria.   °· Bronchoscopy. This test uses a flexible tube (bronchoscope) to see inside the lungs. °TREATMENT  °Treatment will usually include antibiotic medicines. Other medicines may also be used to reduce fever or pain. You may need to be treated in the hospital. In the hospital, your breathing will be carefully monitored. Depending on how well you are breathing, you may need to be given oxygen, or you may need breathing support from a breathing machine (ventilator). For people who fail a swallowing study, a feeding tube might be placed in the stomach, or they may be asked to avoid certain food textures or liquids when they eat. °HOME CARE INSTRUCTIONS  °· Carefully follow any special eating instructions you were given, such as avoiding certain food textures or thickening liquids. This reduces the risk of developing aspiration pneumonia again.  °· Only take over-the-counter or prescription medicines as directed by your health care provider. Follow the directions carefully.   °· If you were prescribed antibiotics, take them as directed. Finish them even if you start to feel better.   °· Rest as instructed by your health care provider.   °· Keep all follow-up appointments with your health care provider.   °SEEK MEDICAL CARE IF:  °· You develop worsening shortness of breath, wheezing, or difficulty breathing.   °· You develop a fever.   °· You have chest pain.   °MAKE SURE YOU:  °· Understand these instructions. °· Will watch your   condition.  Will get help right away if you are not doing well or get worse. Document Released: 08/16/2009 Document Revised: 06/21/2013 Document Reviewed: 04/06/2013 [facility] Patient Information 2014 [place].  Pneumonitis Pneumonitis is inflammation of the lungs.  CAUSES  Many things can cause pneumonitis. These can include:   A bacterial or  viral infection. Pneumonitis due to an infection is usually called pneumonia.  Work-related exposures, including farm and industrial work. Some substances that can cause pneumonitis include asbestos, silica, inhaled acids, or inhaled chlorine gas.   Repeated exposure to bird feathers, bird feces, or other allergens.   Medicine such as chemotherapy drugs, certain antibiotics, and some heart medicines.   Radiation therapy.   Exposure to mold. A hot tub, sauna, or home humidifier can have mold growing in it, even if it looks clean. The mold can be breathed in through water vapor.  Breathing (aspirating) stomach contents, food, or liquids into the lungs.  SIGNS AND SYMPTOMS   Cough.   Shortness of breath or difficulty breathing.   Fever.   Decreased energy.   Decreased appetite.  DIAGNOSIS  To diagnose pneumonitis, your health care provider will do a complete history and physical exam. Various tests may be ordered, such as:   Pulmonary function test.   Chest X-ray.   CT scan of the lungs.   Bronchoscopy.   Lung biopsy.  TREATMENT  Treatment will depend on the cause of the pneumonitis. If the cause is exposure to a substance, avoiding further exposure to that substance will help reduce your symptoms. Possible medical treatments for pneumonitis include:   Corticosteroid medicine to help decrease inflammation in the lungs.   Antibiotic medicine to help fight a bacterial lung infection.   Oxygen therapy if you are having difficulty breathing.  HOME CARE INSTRUCTIONS   Avoid exposure to any substance identified as the cause of your pneumonitis.   If you must continue to work with substances that can cause pneumonitis, wear a mask to protect your lungs.   Only take over-the-counter or prescription medicine as directed by your health care provider.   Do not smoke.   If you use inhalers, keep them with you at all times.   Follow up with your health  care provider as directed.  SEEK IMMEDIATE MEDICAL CARE IF:   You develop new or increased shortness of breath.   You develop a blue color (cyanosis) under your fingernails.   You have a fever.  MAKE SURE YOU:   Understand these instructions.  Will watch your condition.  Will get help right away if you are not doing well or get worse. Document Released: 04/08/2010 Document Revised: 06/21/2013 Document Reviewed: 04/10/2013 [facility] Patient Information 2014 [place], [place].

## 2014-01-15 NOTE — ED Provider Notes (Signed)
CSN: [id number]     Arrival date & time 01/15/14  1942 History   None    Chief Complaint  Patient presents with  . Shortness of Breath   (Consider location/radiation/quality/duration/timing/severity/associated sxs/prior Treatment) HPI Comments: 63 year old male presents for evaluation of possible aspiration pneumonia. On Saturday, he woke up in the middle of the night choking and with severe reflux. He has a history of reflux that has been well-controlled up until now with omeprazole. He has never had an issue like this with aspiration in the past, although the symptoms that went to the diagnosis of GERD with this patient was chronic cough. Since Saturday, he has had shortness of breath on exertion and a bad cough. He denies fever, chills, NVD. He has been using an albuterol inhaler frequently which is not helping a great deal.  Patient is a 63 y.o. male presenting with shortness of breath.  Shortness of Breath Associated symptoms: cough and wheezing   Associated symptoms: no abdominal pain, no chest pain, no fever, no neck pain, no rash, no sore throat and no vomiting     Past Medical History  Diagnosis Date  . Bronchitis   . Hyperlipemia   . Low testosterone   . Bronchitis     history   Past Surgical History  Procedure Laterality Date  . Knee surgery    . Arthroscopic repair acl  1992    left knee  . Knee arthroscopy      left  . Inguinal hernia repair      right  . Colonoscopy    . Ear cyst excision  08/19/2012    Procedure: CYST REMOVAL;  Surgeon: Jerrell Belfast, MD;  Location: [facility];  Service: ENT;  Laterality: Left;  excision of left  infra auricular cyst    History reviewed. No pertinent family history. History  Substance Use Topics  . Smoking status: Never Smoker   . Smokeless tobacco: Not on file  . Alcohol Use: No    Review of Systems  Constitutional: Negative for fever, chills and fatigue.  HENT: Negative for sore throat.   Eyes:  Negative for visual disturbance.  Respiratory: Positive for cough, shortness of breath and wheezing.   Cardiovascular: Negative for chest pain, palpitations and leg swelling.  Gastrointestinal: Negative for nausea, vomiting, abdominal pain, diarrhea and constipation.  Genitourinary: Negative for dysuria, urgency, frequency and hematuria.  Musculoskeletal: Negative for arthralgias, myalgias, neck pain and neck stiffness.  Skin: Negative for rash.  Neurological: Negative for dizziness, weakness and light-headedness.    Allergies  Codeine  Home Medications   Current Outpatient Rx  Name  Route  Sig  Dispense  Refill  . albuterol (PROVENTIL HFA;VENTOLIN HFA) 108 (90 BASE) MCG/ACT inhaler   Inhalation   Inhale 2 puffs into the lungs every 6 (six) hours as needed.         Marland Kitchen OMEPRAZOLE PO   Oral   Take by mouth.         . simvastatin (ZOCOR) 40 MG tablet   Oral   Take 40 mg by mouth every evening.         Marland Kitchen amoxicillin-clavulanate (AUGMENTIN) 500-125 MG per tablet   Oral   Take 1 tablet (500 mg total) by mouth 2 (two) times daily.   20 tablet   0   . amoxicillin-clavulanate (AUGMENTIN) 875-125 MG per tablet   Oral   Take 1 tablet by mouth every 12 (twelve) hours.   14 tablet   0   .  predniSONE (DELTASONE) 50 MG tablet   Oral   Take 1 tablet (50 mg total) by mouth daily with breakfast.   5 tablet   0   . testosterone (ANDROGEL) 50 MG/5GM GEL   Transdermal   Place 5 g onto the skin daily. Uses the under arm tx axeron          BP 121/71  Pulse 86  Temp(Src) 98.6 F (37 C) (Oral)  Resp 24  SpO2 93% Physical Exam  Nursing note and vitals reviewed. Constitutional: He is oriented to person, place, and time. He appears well-developed and well-nourished. No distress.  HENT:  Head: Normocephalic and atraumatic.  Right Ear: External ear normal.  Left Ear: External ear normal.  Nose: Nose normal.  Mouth/Throat: Oropharynx is clear and moist. No oropharyngeal  exudate.  Eyes: Conjunctivae are normal. Right eye exhibits no discharge. Left eye exhibits no discharge.  Neck: Normal range of motion.  Cardiovascular: Normal rate, regular rhythm and normal heart sounds.  Exam reveals no gallop and no friction rub.   No murmur heard. Pulmonary/Chest: Effort normal. No respiratory distress. He has wheezes (diffuse, end expiratory). He has no rales.  Neurological: He is alert and oriented to person, place, and time. Coordination normal.  Skin: Skin is warm and dry. No rash noted. He is not diaphoretic.  Psychiatric: He has a normal mood and affect. Judgment normal.    ED Course  Procedures (including critical care time) Labs Review Labs Reviewed - No data to display Imaging Review Dg Chest 2 View  01/15/2014   CLINICAL DATA:  Hypoxia  EXAM: CHEST  2 VIEW  COMPARISON:  DG CHEST 2 VIEW dated 03/06/2010  FINDINGS: Low lung volumes are present, causing crowding of the pulmonary vasculature. Cardiac and mediastinal margins appear normal. No pleural effusion. The lungs appear otherwise clear.  IMPRESSION: 1. Low lung volumes.  No acute findings.   Electronically Signed   By: Sherryl Barters M.D.   On: 01/15/2014 20:11     MDM   1. Aspiration pneumonitis    Chest x-ray is normal. Treating with corticosteroids for aspiration pneumonitis, continue albuterol, will use Augmentin for pneumonia prophylaxis in the setting of aspiration. He will followup with his primary care physician in 48 hours   Meds ordered this encounter  Medications  . OMEPRAZOLE PO    Sig: Take by mouth.  Marland Kitchen ipratropium-albuterol (DUONEB) 0.5-2.5 (3) MG/3ML nebulizer solution 3 mL    Sig:   . methylPREDNISolone sodium succinate (SOLU-MEDROL) 125 mg/2 mL injection 125 mg    Sig:   . amoxicillin-clavulanate (AUGMENTIN) 875-125 MG per tablet    Sig: Take 1 tablet by mouth every 12 (twelve) hours.    Dispense:  14 tablet    Refill:  0    Order Specific Question:  Supervising Provider     Answer:  Billy Fischer [phone]  . predniSONE (DELTASONE) 50 MG tablet    Sig: Take 1 tablet (50 mg total) by mouth daily with breakfast.    Dispense:  5 tablet    Refill:  0    Order Specific Question:  Supervising Provider    Answer:  Ihor Gully D [place], PA-C 01/15/14 2030

## 2014-01-16 NOTE — ED Provider Notes (Signed)
Medical screening examination/treatment/procedure(s) were performed by resident physician or non-physician practitioner and as supervising physician I was immediately available for consultation/collaboration.   KINDL,JAMES DOUGLAS MD.   James D Kindl, MD 01/16/14 0807 

## 2014-04-30 ENCOUNTER — Emergency Department (HOSPITAL_COMMUNITY)
Admission: EM | Admit: 2014-04-30 | Discharge: 2014-04-30 | Disposition: A | Discharge status: Home / Self Care | Payer: 59 | Attending: Emergency Medicine | Admitting: Emergency Medicine

## 2014-04-30 ENCOUNTER — Encounter (HOSPITAL_COMMUNITY): Payer: Self-pay | Admitting: Emergency Medicine

## 2014-04-30 DIAGNOSIS — Z79899 Other long term (current) drug therapy: Secondary | ICD-10-CM | POA: Insufficient documentation

## 2014-04-30 DIAGNOSIS — S61409A Unspecified open wound of unspecified hand, initial encounter: Secondary | ICD-10-CM | POA: Insufficient documentation

## 2014-04-30 DIAGNOSIS — Y9389 Activity, other specified: Secondary | ICD-10-CM | POA: Insufficient documentation

## 2014-04-30 DIAGNOSIS — E785 Hyperlipidemia, unspecified: Secondary | ICD-10-CM | POA: Insufficient documentation

## 2014-04-30 DIAGNOSIS — Y929 Unspecified place or not applicable: Secondary | ICD-10-CM | POA: Insufficient documentation

## 2014-04-30 DIAGNOSIS — Z8709 Personal history of other diseases of the respiratory system: Secondary | ICD-10-CM | POA: Insufficient documentation

## 2014-04-30 DIAGNOSIS — S61452A Open bite of left hand, initial encounter: Secondary | ICD-10-CM

## 2014-04-30 DIAGNOSIS — W540XXA Bitten by dog, initial encounter: Secondary | ICD-10-CM | POA: Insufficient documentation

## 2014-04-30 DIAGNOSIS — Z792 Long term (current) use of antibiotics: Secondary | ICD-10-CM | POA: Insufficient documentation

## 2014-04-30 MED ORDER — AMOXICILLIN-POT CLAVULANATE 875-125 MG PO TABS: 1.0000 | ORAL_TABLET | Freq: Two times a day (BID) | ORAL | Status: DC

## 2014-04-30 NOTE — ED Notes (Signed)
Patient is agitated with wait time, discussed delay in care.

## 2014-04-30 NOTE — ED Notes (Signed)
Pt reports getting a bite to left ring finger by dog today that did not belong to him. Has already contacted animal control. No acute distress noted at triage. Pt cleaned finger pta.

## 2014-04-30 NOTE — Discharge Instructions (Signed)
1. Medications: augmenting, usual home medications 2. Treatment: rest, drink plenty of fluids, keep wound clean with warm soap and water 3. Follow Up: Please followup with your primary doctor in 48 hours for discussion of your diagnoses and further evaluation after today's visit;   Animal Bite An animal bite can result in a scratch on the skin, deep open cut, puncture of the skin, crush injury, or tearing away of the skin or a body part. Dogs are responsible for most animal bites. Children are bitten more often than adults. An animal bite can range from very mild to more serious. A small bite from your house pet is no cause for alarm. However, some animal bites can become infected or injure a bone or other tissue. You must seek medical care if:  The skin is broken and bleeding does not slow down or stop after 15 minutes.  The puncture is deep and difficult to clean (such as a cat bite).  Pain, warmth, redness, or pus develops around the wound.  The bite is from a stray animal or rodent. There may be a risk of rabies infection.  The bite is from a snake, raccoon, skunk, fox, coyote, or bat. There may be a risk of rabies infection.  The person bitten has a chronic illness such as diabetes, liver disease, or cancer, or the person takes medicine that lowers the immune system.  There is concern about the location and severity of the bite. It is important to clean and protect an animal bite wound right away to prevent infection. Follow these steps:  Clean the wound with plenty of water and soap.  Apply an antibiotic cream.  Apply gentle pressure over the wound with a clean towel or gauze to slow or stop bleeding.  Elevate the affected area above the heart to help stop any bleeding.  Seek medical care. Getting medical care within 8 hours of the animal bite leads to the best possible outcome. DIAGNOSIS  Your caregiver will most likely:  Take a detailed history of the animal and the bite  injury.  Perform a wound exam.  Take your medical history. Blood tests or X-rays may be performed. Sometimes, infected bite wounds are cultured and sent to a lab to identify the infectious bacteria.  TREATMENT  Medical treatment will depend on the location and type of animal bite as well as the patient's medical history. Treatment may include:  Wound care, such as cleaning and flushing the wound with saline solution, bandaging, and elevating the affected area.  Antibiotics.  Tetanus immunization.  Rabies immunization.  Leaving the wound open to heal. This is often done with animal bites, due to the high risk of infection. However, in certain cases, wound closure with stitches, wound adhesive, skin adhesive strips, or staples may be used. Infected bites that are left untreated may require intravenous (IV) antibiotics and surgical treatment in the hospital. [place]  Follow your caregiver's instructions for wound care.  Take all medicines as directed.  If your caregiver prescribes antibiotics, take them as directed. Finish them even if you start to feel better.  Follow up with your caregiver for further exams or immunizations as directed. You may need a tetanus shot if:  You cannot remember when you had your last tetanus shot.  You have never had a tetanus shot.  The injury broke your skin. If you get a tetanus shot, your arm may swell, get red, and feel warm to the touch. This is common and  not a problem. If you need a tetanus shot and you choose not to have one, there is a rare chance of getting tetanus. Sickness from tetanus can be serious. SEEK MEDICAL CARE IF:  You notice warmth, redness, soreness, swelling, pus discharge, or a bad smell coming from the wound.  You have a red line on the skin coming from the wound.  You have a fever, chills, or a general ill feeling.  You have nausea or vomiting.  You have continued or worsening pain.  You have  trouble moving the injured part.  You have other questions or concerns. MAKE SURE YOU:  Understand these instructions.  Will watch your condition.  Will get help right away if you are not doing well or get worse. Document Released: 07/07/2011 Document Revised: 01/11/2012 Document Reviewed: 07/07/2011 [facility] Patient Information 2015 [place], [place]. This information is not intended to replace advice given to you by your health care provider. Make sure you discuss any questions you have with your health care provider.

## 2014-04-30 NOTE — ED Notes (Signed)
Patient anxious in leaving, took paperwork and signed for discharge, not interested in review and verifying understanding follow up care.

## 2014-04-30 NOTE — ED Provider Notes (Signed)
CSN: [id number]     Arrival date & time 04/30/14  1735 History  This chart was scribed for non-physician practitioner Theodis Sato, PA-C working with [place], DO by Eston Mould, ED Scribe. This patient was seen in room TR06C/TR06C and the patient's care was started at 7:59 PM .  Chief Complaint  Patient presents with  . Animal Bite   The history is provided by the patient and medical records. No language interpreter was used.   HPI Comments: [name] is a 63 y.o. male who presents to the Emergency Department complaining of animal bite that occurred 6 hours ago. Pt states he was bite by a dog while riding his bike; states he is filing a report and has notified animal control. He has cleaned the wound with betadine and denies needing rabies vaccine due to discussion with the vet. Denies fever and chills.  Pt is curt and refuses to answer further questions about the event or his medical Hx.    Past Medical History  Diagnosis Date  . Bronchitis   . Hyperlipemia   . Low testosterone   . Bronchitis     history   Past Surgical History  Procedure Laterality Date  . Knee surgery    . Arthroscopic repair acl  1992    left knee  . Knee arthroscopy      left  . Inguinal hernia repair      right  . Colonoscopy    . Ear cyst excision  08/19/2012    Procedure: CYST REMOVAL;  Surgeon: Jerrell Belfast, MD;  Location: [facility];  Service: ENT;  Laterality: Left;  excision of left  infra auricular cyst    History reviewed. No pertinent family history. History  Substance Use Topics  . Smoking status: Never Smoker   . Smokeless tobacco: Not on file  . Alcohol Use: No    Review of Systems  Constitutional: Negative for fever and chills.  Gastrointestinal: Negative for nausea and vomiting.  Skin: Positive for wound. Negative for color change.  Allergic/Immunologic: Negative for immunocompromised state.  Neurological: Negative for weakness and  numbness.  Hematological: Does not bruise/bleed easily.  Psychiatric/Behavioral: The patient is not nervous/anxious.       Allergies  Codeine  Home Medications   Prior to Admission medications   Medication Sig Start Date End Date Taking? Authorizing Provider  albuterol (PROVENTIL HFA;VENTOLIN HFA) 108 (90 BASE) MCG/ACT inhaler Inhale 2 puffs into the lungs every 6 (six) hours as needed for shortness of breath.    Yes Historical Provider, MD  simvastatin (ZOCOR) 40 MG tablet Take 40 mg by mouth every evening.   Yes Historical Provider, MD  amoxicillin-clavulanate (AUGMENTIN) 500-125 MG per tablet Take 1 tablet by mouth 2 (two) times daily. Completed months ago from today (04-30-14) per patient 08/19/12   Jerrell Belfast, MD  amoxicillin-clavulanate (AUGMENTIN) 875-125 MG per tablet Take 1 tablet by mouth every 12 (twelve) hours. Completed course of medication months ago from today (04-30-14) per patient 01/15/14   Liam Graham, PA-C  amoxicillin-clavulanate (AUGMENTIN) 875-125 MG per tablet Take 1 tablet by mouth every 12 (twelve) hours. 04/30/14   Hannah Muthersbaugh, PA-C   BP 129/86  Pulse 73  Temp(Src) 98.4 F (36.9 C) (Oral)  Resp 16  SpO2 96% Physical Exam  Nursing note and vitals reviewed. Constitutional: He is oriented to person, place, and time. He appears well-developed and well-nourished. No distress.  HENT:  Head: Normocephalic and atraumatic.  Eyes:  Conjunctivae are normal. No scleral icterus.  Neck: Normal range of motion.  Cardiovascular: Normal rate, regular rhythm, normal heart sounds and intact distal pulses.   No murmur heard. Capillary refill < 3 sec  Pulmonary/Chest: Effort normal and breath sounds normal. No respiratory distress.  Musculoskeletal: Normal range of motion. He exhibits no edema.  Full ROM: without pain to all fingers of L hand.  Neurological: He is alert and oriented to person, place, and time.  Sensation: intact to dull and sharp Strength:  5/5 including resistance to flexion, abduction, adduction, and extension without pain.  Skin: Skin is warm and dry. He is not diaphoretic.  Abrasions to the L middle and ring fingers to the dorsal side. No deep lacerations, erythema, induration or evidence of infection.   Psychiatric: He has a normal mood and affect.    ED Course  Procedures  DIAGNOSTIC STUDIES: Oxygen Saturation is 96% on  RA, normal by my interpretation.    COORDINATION OF CARE: 8:01 PM-Discussed treatment plan which includes discharge pt with Augmentin and have wound evaluated in 48-hours. Informed pt to return to the ED if fevers, redness, drainage, or swelling present. Pt agreed to plan.   Labs Review Labs Reviewed - No data to display  Imaging Review No results found.   EKG Interpretation None     MDM   Final diagnoses:  Dog bite of left hand, initial encounter   [name] presents to the ED after dig bite with abrasions.  Pt is rude, curt and unhappy, refusing to allow a thorough history or physical.  Pt reports that he cleaned the wounds himself and refuses for them to be cleaned again in the ED.  He reports that his is "only here for documentation of wound and antibiotics."  Pt Alert and oriented, NAD, nontoxic, nonseptic appearing.  Capillary refill intact and pt without neurologic deficit.  Unknown last tetanus, Hx of diabetes, HIV or immunosuppression as pt does not allow answers to these questions.  Augmentin given and I request close follow-up with PCP or back in the ER in 48 hours. Pt reports he will likely not follow-up.    I have personally reviewed patient's vitals, nursing note and any pertinent labs or imaging. At this time, it has been determined that no acute conditions requiring further emergency intervention. The patient/guardian have been advised of the diagnosis and plan. I reviewed all labs and imaging including any potential incidental findings. We have discussed signs and symptoms that  warrant return to the ED, such as fever, chills, redness or purulent drainage.  Patient/guardian has voiced understanding and agreed to follow-up with the PCP or specialist in 48 hours.  Vital signs are stable at discharge.   BP 129/86  Pulse 73  Temp(Src) 98.4 F (36.9 C) (Oral)  Resp 16  SpO2 96%  I personally performed the services described in this documentation, which was scribed in my presence. The recorded information has been reviewed and is accurate.    Jarrett Soho Muthersbaugh, PA-C 04/30/14 2015

## 2014-04-30 NOTE — ED Provider Notes (Signed)
Medical screening examination/treatment/procedure(s) were performed by non-physician practitioner and as supervising physician I was immediately available for consultation/collaboration.   EKG Interpretation None        [place], DO 04/30/14 2314

## 2015-11-27 DIAGNOSIS — H524 Presbyopia: Secondary | ICD-10-CM | POA: Diagnosis not present

## 2015-11-27 DIAGNOSIS — H52223 Regular astigmatism, bilateral: Secondary | ICD-10-CM | POA: Diagnosis not present

## 2015-11-27 DIAGNOSIS — H5203 Hypermetropia, bilateral: Secondary | ICD-10-CM | POA: Diagnosis not present

## 2015-12-16 MED FILL — AXIRON 30 MG/ACTUATION SOLN: 30 | 90 days supply | Qty: 270 | Fill #0

## 2015-12-16 MED FILL — SIMVASTATIN 40 MG TABLET: 40 | 90 days supply | Qty: 90 | Fill #0

## 2015-12-16 MED FILL — FAMCICLOVIR 250 MG TABLET: 250 | 90 days supply | Qty: 90 | Fill #0

## 2016-01-05 IMAGING — CR DG CHEST 2V
2 series · 2 of 2 positions shown · non-contrast
Comparison: DG CHEST 2 VIEW dated 03/06/2010

CLINICAL DATA: Hypoxia

EXAM:
CHEST  2 VIEW

[view not recorded (1 of 2)]
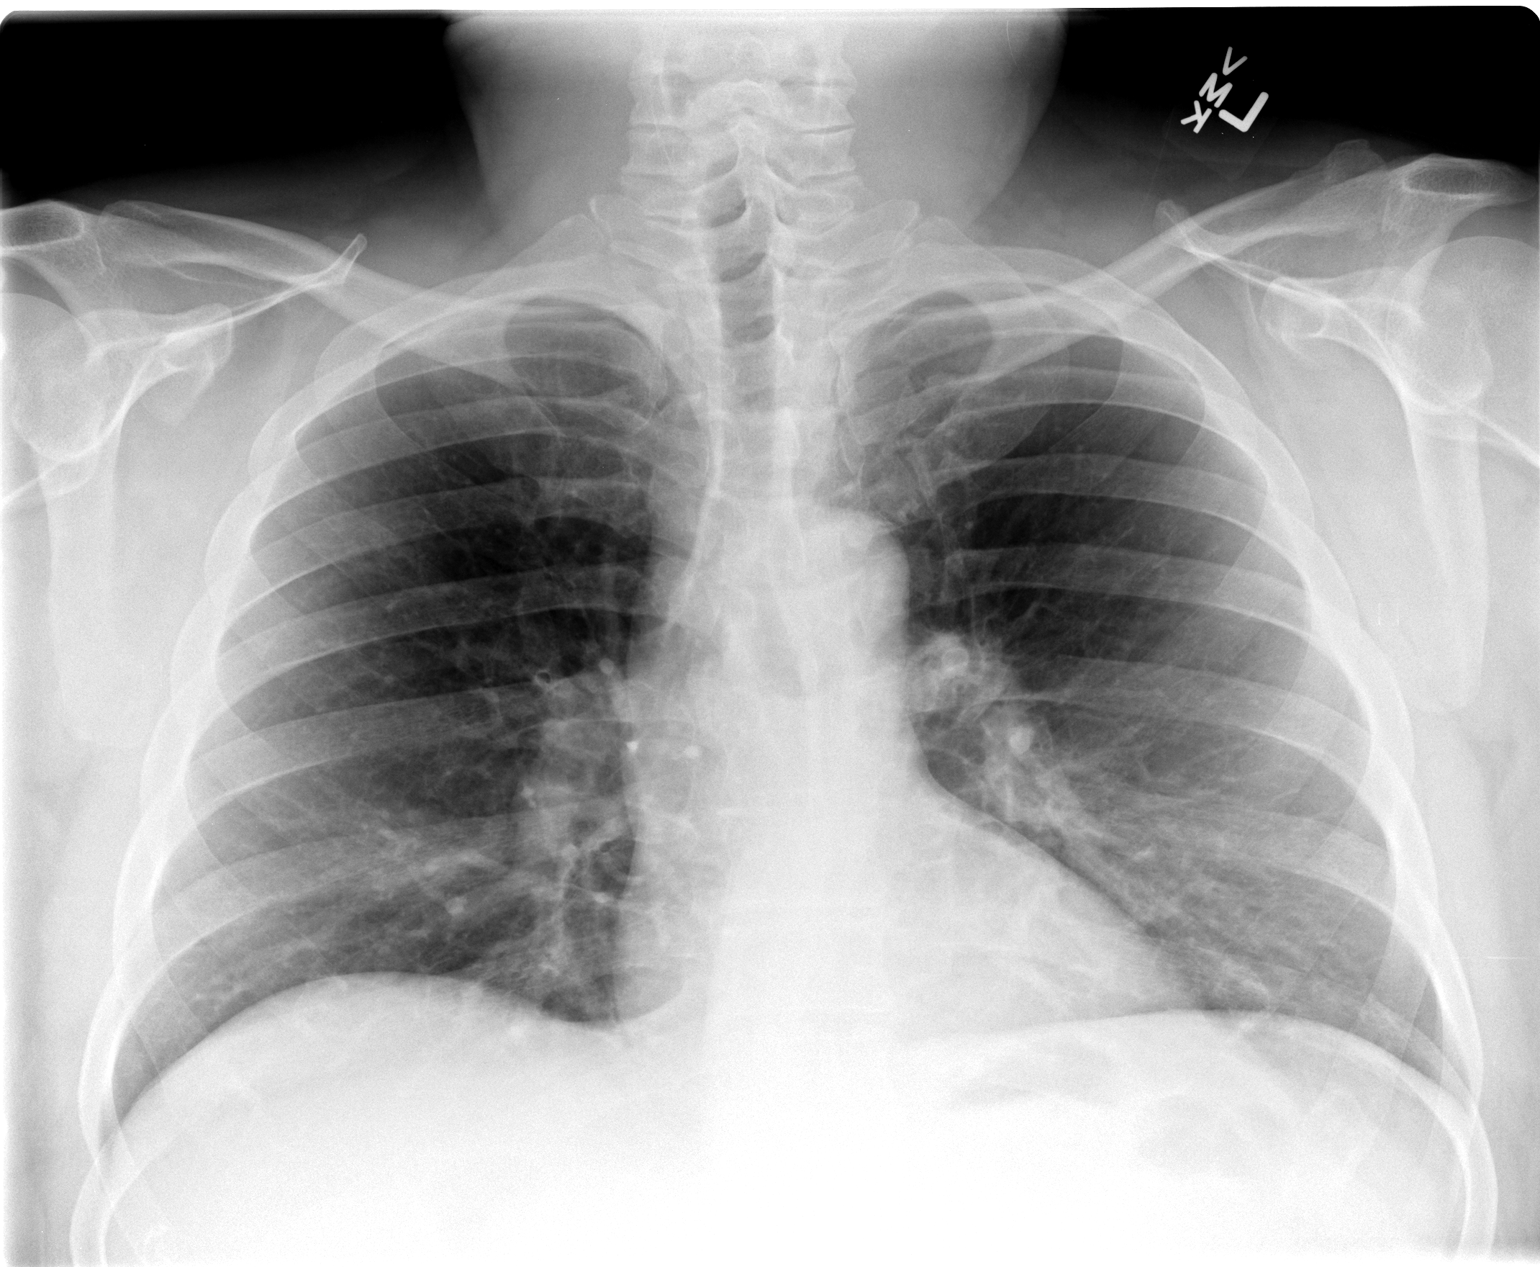

[view not recorded (2 of 2)]
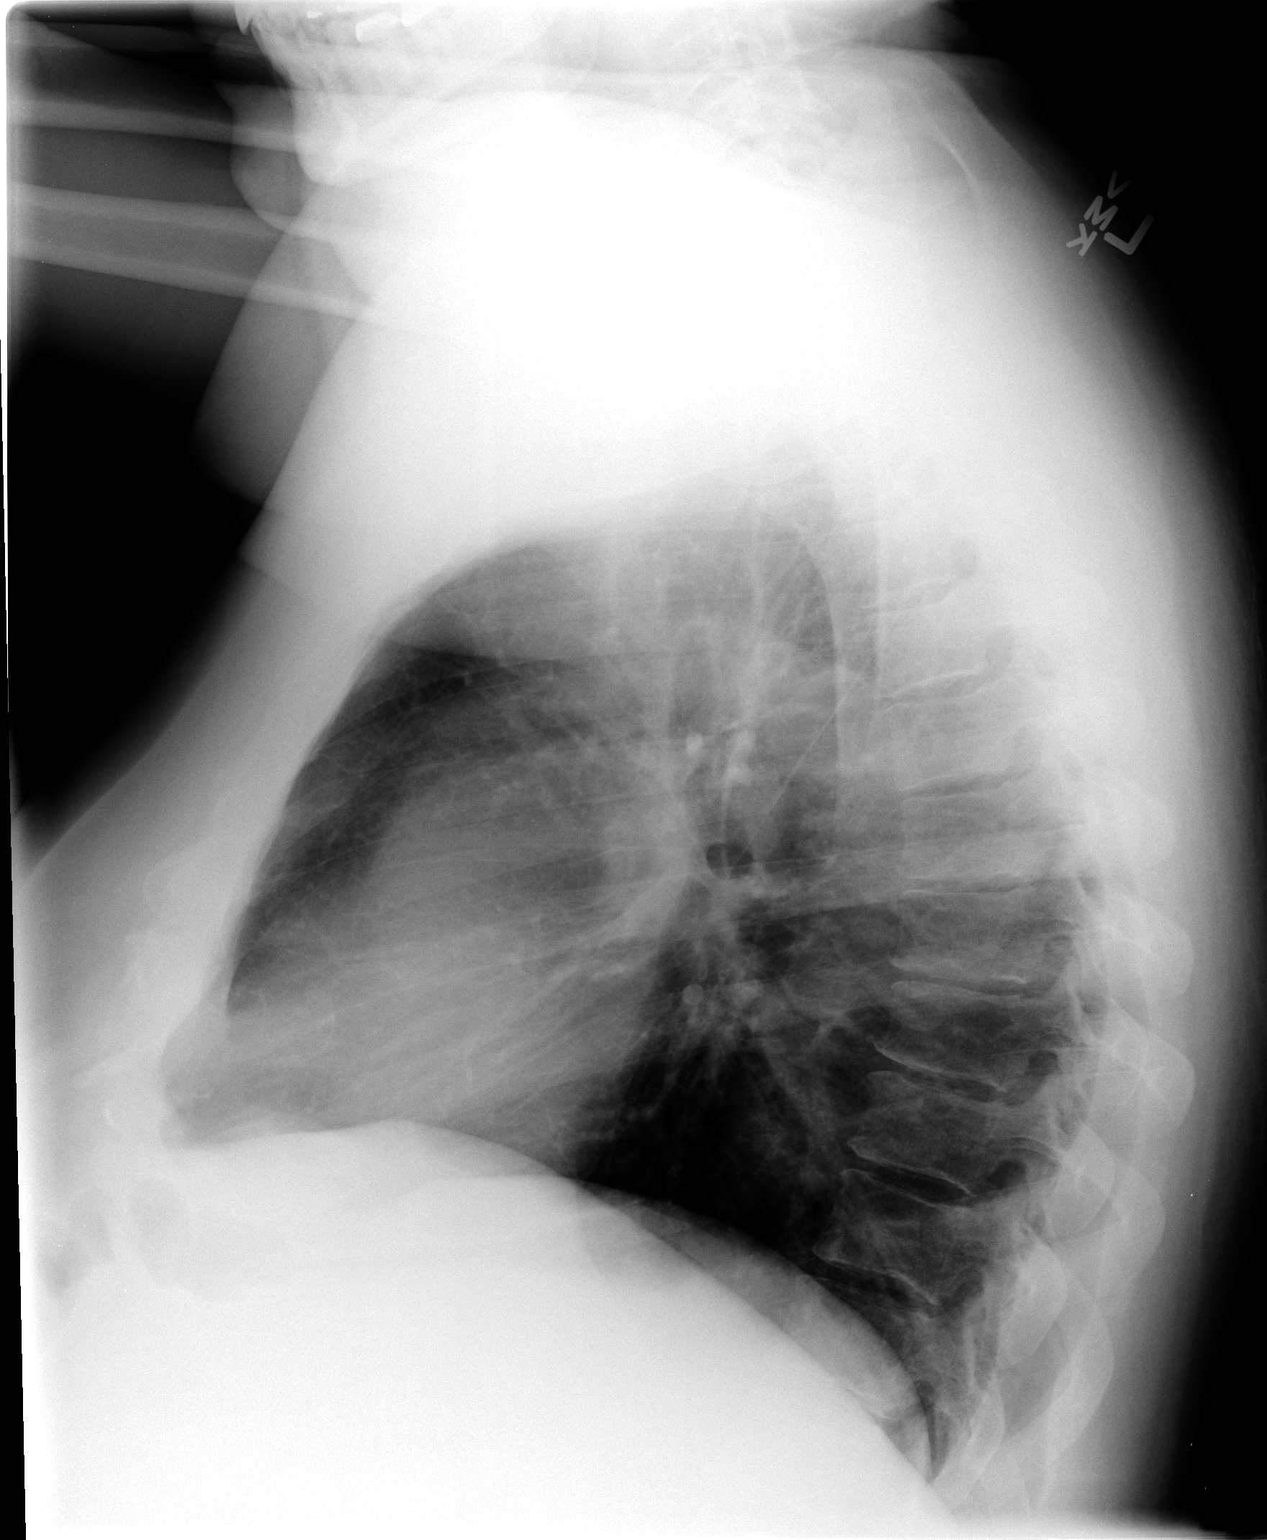

[2 of 2 positions shown; findings below may reference images not displayed]

FINDINGS: Low lung volumes are present, causing crowding of the pulmonary
vasculature. Cardiac and mediastinal margins appear normal. No
pleural effusion. The lungs appear otherwise clear.
IMPRESSION: 1. Low lung volumes.  No acute findings.

## 2016-03-17 MED FILL — SIMVASTATIN 40 MG TABLET: 40 | 90 days supply | Qty: 90 | Fill #1

## 2016-03-17 MED FILL — FAMCICLOVIR 250 MG TABLET: 250 | 90 days supply | Qty: 90 | Fill #1

## 2016-04-14 DIAGNOSIS — E789 Disorder of lipoprotein metabolism, unspecified: Secondary | ICD-10-CM | POA: Diagnosis not present

## 2016-04-14 DIAGNOSIS — Z125 Encounter for screening for malignant neoplasm of prostate: Secondary | ICD-10-CM | POA: Diagnosis not present

## 2016-04-14 DIAGNOSIS — E291 Testicular hypofunction: Secondary | ICD-10-CM | POA: Diagnosis not present

## 2016-04-14 DIAGNOSIS — E669 Obesity, unspecified: Secondary | ICD-10-CM | POA: Diagnosis not present

## 2016-04-27 DIAGNOSIS — S4991XA Unspecified injury of right shoulder and upper arm, initial encounter: Secondary | ICD-10-CM | POA: Diagnosis not present

## 2016-04-27 DIAGNOSIS — E789 Disorder of lipoprotein metabolism, unspecified: Secondary | ICD-10-CM | POA: Diagnosis not present

## 2016-04-27 DIAGNOSIS — E291 Testicular hypofunction: Secondary | ICD-10-CM | POA: Diagnosis not present

## 2016-04-27 DIAGNOSIS — M25511 Pain in right shoulder: Secondary | ICD-10-CM | POA: Diagnosis not present

## 2016-04-27 DIAGNOSIS — M25519 Pain in unspecified shoulder: Secondary | ICD-10-CM | POA: Diagnosis not present

## 2016-04-27 MED FILL — AXIRON 30 MG/ACTUATION SOLN: 30 | 90 days supply | Qty: 270 | Fill #0

## 2016-04-27 MED FILL — DOXYCYCLINE HYCLATE 100 MG: 100 | 15 days supply | Qty: 30 | Fill #0

## 2016-04-28 DIAGNOSIS — N489 Disorder of penis, unspecified: Secondary | ICD-10-CM | POA: Diagnosis not present

## 2016-05-01 DIAGNOSIS — N471 Phimosis: Secondary | ICD-10-CM | POA: Diagnosis not present

## 2016-05-01 DIAGNOSIS — Q5569 Other congenital malformation of penis: Secondary | ICD-10-CM | POA: Diagnosis not present

## 2016-05-01 MED FILL — BETAMETHASONE DP AUG 0.05%: 0.05 | 25 days supply | Qty: 50 | Fill #0

## 2016-06-02 DIAGNOSIS — N471 Phimosis: Secondary | ICD-10-CM | POA: Diagnosis not present

## 2016-06-23 MED FILL — SIMVASTATIN 40 MG TABLET: 40 | 90 days supply | Qty: 90 | Fill #2

## 2016-07-20 MED FILL — FAMCICLOVIR 250 MG TABLET: 250 | 90 days supply | Qty: 90 | Fill #2

## 2016-07-22 MED FILL — GAVILYTE-N SOLUTION: 420 | 1 days supply | Qty: 4000 | Fill #0

## 2016-07-24 DIAGNOSIS — N471 Phimosis: Secondary | ICD-10-CM | POA: Diagnosis not present

## 2016-07-28 ENCOUNTER — Other Ambulatory Visit: Payer: Self-pay | Admitting: Urology

## 2016-08-03 ENCOUNTER — Encounter (HOSPITAL_BASED_OUTPATIENT_CLINIC_OR_DEPARTMENT_OTHER): Payer: Self-pay | Admitting: *Deleted

## 2016-08-03 NOTE — Progress Notes (Signed)
NPO AFTER MN.  ARRIVE AT 0600.  NEEDS HG.  

## 2016-08-05 NOTE — Anesthesia Preprocedure Evaluation (Addendum)
Anesthesia Evaluation  Patient identified by MRN, date of birth, ID band Patient awake    Reviewed: Allergy & Precautions, H&P , NPO status , Patient's Chart, lab work & pertinent test results  History of Anesthesia Complications Negative for: history of anesthetic complications  Airway Mallampati: I  TM Distance: >3 FB Neck ROM: Full    Dental no notable dental hx. (+) Dental Advisory Given   Pulmonary neg pulmonary ROS,    Pulmonary exam normal        Cardiovascular negative cardio ROS Normal cardiovascular exam     Neuro/Psych negative neurological ROS  negative psych ROS   GI/Hepatic Neg liver ROS, GERD  Medicated,  Endo/Other  negative endocrine ROS  Renal/GU negative Renal ROS     Musculoskeletal   Abdominal (+) - obese,   Peds  Hematology   Anesthesia Other Findings   Reproductive/Obstetrics                            Anesthesia Physical  Anesthesia Plan  ASA: II  Anesthesia Plan: MAC   Post-op Pain Management:    Induction: Intravenous  Airway Management Planned: Natural Airway  Additional Equipment:   Intra-op Plan:   Post-operative Plan:   Informed Consent: I have reviewed the patients History and Physical, chart, labs and discussed the procedure including the risks, benefits and alternatives for the proposed anesthesia with the patient or authorized representative who has indicated his/her understanding and acceptance.   Dental advisory given  Plan Discussed with: CRNA and Anesthesiologist  Anesthesia Plan Comments:        Anesthesia Quick Evaluation

## 2016-08-06 ENCOUNTER — Encounter (HOSPITAL_BASED_OUTPATIENT_CLINIC_OR_DEPARTMENT_OTHER): Payer: Self-pay

## 2016-08-06 ENCOUNTER — Ambulatory Visit (HOSPITAL_BASED_OUTPATIENT_CLINIC_OR_DEPARTMENT_OTHER): Payer: 59 | Admitting: Anesthesiology

## 2016-08-06 ENCOUNTER — Encounter (HOSPITAL_BASED_OUTPATIENT_CLINIC_OR_DEPARTMENT_OTHER)
Admission: RE | Disposition: A | Discharge status: Home / Self Care | Payer: Self-pay | Source: Ambulatory Visit | Attending: Urology

## 2016-08-06 ENCOUNTER — Ambulatory Visit (HOSPITAL_BASED_OUTPATIENT_CLINIC_OR_DEPARTMENT_OTHER)
Admission: RE | Admit: 2016-08-06 | Discharge: 2016-08-06 | Disposition: A | Discharge status: Home / Self Care | Payer: 59 | Source: Ambulatory Visit | Attending: Urology | Admitting: Urology

## 2016-08-06 DIAGNOSIS — E669 Obesity, unspecified: Secondary | ICD-10-CM | POA: Insufficient documentation

## 2016-08-06 DIAGNOSIS — N485 Ulcer of penis: Secondary | ICD-10-CM | POA: Diagnosis present

## 2016-08-06 DIAGNOSIS — K219 Gastro-esophageal reflux disease without esophagitis: Secondary | ICD-10-CM | POA: Diagnosis not present

## 2016-08-06 DIAGNOSIS — N471 Phimosis: Secondary | ICD-10-CM | POA: Insufficient documentation

## 2016-08-06 DIAGNOSIS — Z6834 Body mass index (BMI) 34.0-34.9, adult: Secondary | ICD-10-CM | POA: Insufficient documentation

## 2016-08-06 DIAGNOSIS — L089 Local infection of the skin and subcutaneous tissue, unspecified: Secondary | ICD-10-CM

## 2016-08-06 DIAGNOSIS — L729 Follicular cyst of the skin and subcutaneous tissue, unspecified: Secondary | ICD-10-CM

## 2016-08-06 HISTORY — DX: Herpesviral infection, unspecified: B00.9

## 2016-08-06 HISTORY — DX: Personal history of colonic polyps: Z86.010

## 2016-08-06 HISTORY — PX: CIRCUMCISION: SHX1350

## 2016-08-06 HISTORY — DX: Hyperlipidemia, unspecified: E78.5

## 2016-08-06 HISTORY — DX: Snoring: R06.83

## 2016-08-06 HISTORY — DX: Gastro-esophageal reflux disease without esophagitis: K21.9

## 2016-08-06 HISTORY — DX: Personal history of other diseases of the respiratory system: Z87.09

## 2016-08-06 HISTORY — DX: Presence of spectacles and contact lenses: Z97.3

## 2016-08-06 HISTORY — DX: Personal history of adenomatous and serrated colon polyps: Z86.0101

## 2016-08-06 HISTORY — DX: Phimosis: N47.1

## 2016-08-06 LAB — POCT HEMOGLOBIN-HEMACUE: Hemoglobin: 15.2 g/dL (ref 13.0–17.0)

## 2016-08-06 SURGERY — CIRCUMCISION, ADULT
Anesthesia: Monitor Anesthesia Care

## 2016-08-06 MED ORDER — PROPOFOL 10 MG/ML IV BOLUS
INTRAVENOUS | Status: AC
  Filled 2016-08-06: qty 20

## 2016-08-06 MED ORDER — BACITRACIN-NEOMYCIN-POLYMYXIN OINTMENT TUBE
TOPICAL_OINTMENT | CUTANEOUS | Status: DC | PRN
  Administered 2016-08-06: 1 via TOPICAL

## 2016-08-06 MED ORDER — FENTANYL CITRATE (PF) 100 MCG/2ML IJ SOLN
INTRAMUSCULAR | Status: AC
  Filled 2016-08-06: qty 2

## 2016-08-06 MED ORDER — MIDAZOLAM HCL 5 MG/5ML IJ SOLN
INTRAMUSCULAR | Status: DC | PRN
  Administered 2016-08-06: 2 mg via INTRAVENOUS

## 2016-08-06 MED ORDER — BUPIVACAINE HCL 0.25 % IJ SOLN
INTRAMUSCULAR | Status: DC | PRN
  Administered 2016-08-06: 26 mL

## 2016-08-06 MED ORDER — FENTANYL CITRATE (PF) 100 MCG/2ML IJ SOLN
INTRAMUSCULAR | Status: DC | PRN
  Administered 2016-08-06: 25 ug via INTRAVENOUS
  Administered 2016-08-06: 50 ug via INTRAVENOUS
  Administered 2016-08-06: 25 ug via INTRAVENOUS

## 2016-08-06 MED ORDER — PROPOFOL 10 MG/ML IV BOLUS
INTRAVENOUS | Status: AC
  Filled 2016-08-06: qty 40

## 2016-08-06 MED ORDER — TRAMADOL HCL 50 MG PO TABS: 50.0000 mg | ORAL_TABLET | Freq: Four times a day (QID) | ORAL | 0 refills | Status: DC | PRN

## 2016-08-06 MED ORDER — BUPIVACAINE HCL (PF) 0.25 % IJ SOLN
INTRAMUSCULAR | Status: AC
  Filled 2016-08-06: qty 30

## 2016-08-06 MED ORDER — ONDANSETRON HCL 4 MG/2ML IJ SOLN
INTRAMUSCULAR | Status: DC | PRN
Start: 2016-08-06 — End: 2016-08-06
  Administered 2016-08-06: 4 mg via INTRAVENOUS

## 2016-08-06 MED ORDER — ONDANSETRON HCL 4 MG/2ML IJ SOLN
INTRAMUSCULAR | Status: AC
  Filled 2016-08-06: qty 2

## 2016-08-06 MED ORDER — CEFAZOLIN SODIUM-DEXTROSE 2-4 GM/100ML-% IV SOLN
2.0000 g | INTRAVENOUS | Status: AC
  Administered 2016-08-06: 2 g via INTRAVENOUS
  Filled 2016-08-06: qty 100

## 2016-08-06 MED ORDER — MIDAZOLAM HCL 2 MG/2ML IJ SOLN
INTRAMUSCULAR | Status: AC
  Filled 2016-08-06: qty 2

## 2016-08-06 MED ORDER — LACTATED RINGERS IV SOLN
INTRAVENOUS | Status: DC
  Administered 2016-08-06: 07:00:00 via INTRAVENOUS
  Filled 2016-08-06: qty 1000

## 2016-08-06 MED ORDER — LIDOCAINE HCL 1 % IJ SOLN
INTRAMUSCULAR | Status: AC
  Filled 2016-08-06: qty 20

## 2016-08-06 MED ORDER — BACITRACIN ZINC 500 UNIT/GM EX OINT
TOPICAL_OINTMENT | CUTANEOUS | Status: AC
  Filled 2016-08-06: qty 28.35

## 2016-08-06 MED ORDER — CEFAZOLIN SODIUM-DEXTROSE 2-4 GM/100ML-% IV SOLN
INTRAVENOUS | Status: AC
  Filled 2016-08-06: qty 100

## 2016-08-06 MED ORDER — LIDOCAINE HCL 0.5 % IJ SOLN
INTRAMUSCULAR | Status: DC | PRN
  Administered 2016-08-06: 26 mL

## 2016-08-06 MED ORDER — PROPOFOL 500 MG/50ML IV EMUL
INTRAVENOUS | Status: DC | PRN
  Administered 2016-08-06: 100 ug/kg/min via INTRAVENOUS

## 2016-08-06 MED FILL — traMADol HCL 50 MG TABS: 50 | 1 days supply | Qty: 15 | Fill #0

## 2016-08-06 SURGICAL SUPPLY — 34 items
BANDAGE COBAN STERILE 2 (GAUZE/BANDAGES/DRESSINGS) ×1 IMPLANT
BLADE CLIPPER SENSICLIP SURGIC (BLADE) ×2 IMPLANT
BLADE SURG 15 STRL LF DISP TIS (BLADE) ×1 IMPLANT
BLADE SURG 15 STRL SS (BLADE) ×2
BNDG COHESIVE 1X5 TAN STRL LF (GAUZE/BANDAGES/DRESSINGS) ×2 IMPLANT
BNDG CONFORM 2 STRL LF (GAUZE/BANDAGES/DRESSINGS) ×2 IMPLANT
BRIEF STRETCH FOR OB PAD LRG (UNDERPADS AND DIAPERS) ×1 IMPLANT
COVER BACK TABLE 60X90IN (DRAPES) ×2 IMPLANT
COVER MAYO STAND STRL (DRAPES) ×2 IMPLANT
DECANTER SPIKE VIAL GLASS SM (MISCELLANEOUS) IMPLANT
DRAPE LAPAROTOMY 100X72 PEDS (DRAPES) ×2 IMPLANT
ELECT NDL BLADE 2-5/6 (NEEDLE) ×1 IMPLANT
ELECT NEEDLE BLADE 2-5/6 (NEEDLE) ×2 IMPLANT
ELECT REM PT RETURN 9FT ADLT (ELECTROSURGICAL) ×2
ELECTRODE REM PT RTRN 9FT ADLT (ELECTROSURGICAL) ×1 IMPLANT
GAUZE SPONGE 4X4 16PLY XRAY LF (GAUZE/BANDAGES/DRESSINGS) ×1 IMPLANT
GAUZE XEROFORM 1X8 LF (GAUZE/BANDAGES/DRESSINGS) ×2 IMPLANT
GLOVE BIO SURGEON STRL SZ7.5 (GLOVE) ×2 IMPLANT
GOWN STRL REUS W/ TWL XL LVL3 (GOWN DISPOSABLE) ×1 IMPLANT
GOWN STRL REUS W/TWL XL LVL3 (GOWN DISPOSABLE) ×2
KIT ROOM TURNOVER WOR (KITS) ×2 IMPLANT
NDL HYPO 25X1 1.5 SAFETY (NEEDLE) ×1 IMPLANT
NEEDLE HYPO 25X1 1.5 SAFETY (NEEDLE) ×2 IMPLANT
NS IRRIG 500ML POUR BTL (IV SOLUTION) ×1 IMPLANT
PACK BASIN DAY SURGERY FS (CUSTOM PROCEDURE TRAY) ×2 IMPLANT
PENCIL BUTTON HOLSTER BLD 10FT (ELECTRODE) ×2 IMPLANT
SPONGE GAUZE 4X4 12PLY STER LF (GAUZE/BANDAGES/DRESSINGS) ×1 IMPLANT
SUT VIC AB 3-0 PS2 18 (SUTURE) ×2
SUT VIC AB 3-0 PS2 18XBRD (SUTURE) IMPLANT
SUT VIC AB 4-0 PS2 18 (SUTURE) ×2 IMPLANT
SYR CONTROL 10ML LL (SYRINGE) ×2 IMPLANT
TOWEL OR 17X24 6PK STRL BLUE (TOWEL DISPOSABLE) ×4 IMPLANT
TRAY DSU PREP LF (CUSTOM PROCEDURE TRAY) ×2 IMPLANT
TUBE CONNECTING 12X1/4 (SUCTIONS) IMPLANT

## 2016-08-06 NOTE — H&P (Signed)
[name] is a 65 year-old male established patient who is here for further eval and management of his penile issues.  He first noticed the symptoms approximately 07/17/2016.   He does not have difficulty retracting his foreskin. The patient has an ulceration/rash on his penis. The area is painful. He has been treated with creams, ointments, or other medications.   He does not have difficulties achieving an erection. He does not have a history of urinary infections. He does not have dysuria.   He denies any pain/swelling in his inguinal region. He does haveswelling. He has not had a history of trauma to the urethra. He denies dysuria. The patient states that he does not have painful erections.   Patient has been having penile issues since June and August. Things resolved for a while and now it is back.   The patient's symptoms initially resolved with steroid cream, recently it has returned. He is complaining of redness around the shaft and pain at the ventral aspect. He is also noted a tighter fibrotic band.    NIH Symptom Score: He has experienced pain below the waist . He has always had pain or discomfort in any of these areas over the last week. Over the last week, when he had pain or discomfort, he rated it a 2 on a scale of 0-10. He never, over the last week, had a sensation of not emptying his bladder completely after he had finished urinating. He has not had to urinate again less than 2 hours after finishing urinating over the last week. Over the last week, his symptoms have kept him from doing a lot of the things he would usually do. During the last week, he thought a lot about his symptoms. If he were able to spend the rest of his life with his symptoms just the way they have been during the last week, he would feel pleased.    ALLERGIES: Codeine Derivatives   MEDICATIONS: Simvastatin 20 mg tablet  Famciclovir 250 mg tablet  Prostate Health    GU PSH: None    PSH Notes: Inguinal  Hernia Repair, Knee Surgery, ACL reconstruction left knee, cyst behind right ear removed (2016), Right inguinal Hernia (1986)  NON-GU PSH: None       GU PMH: Phimosis - 06/02/2016, - 05/01/2016 Oth Congen Penis malformation - 05/01/2016 Elevated PSA, Elevated prostate specific antigen (PSA) - 2014      PMH Notes:  1898-11-02 00:00:00 - Note: Normal Routine History And Physical Adult   NON-GU PMH: GERD    FAMILY HISTORY: 1 son - Son 2 daughters - Daughter Dementia - Father Family Health Status Number - Runs In Family Kidney Stones - Mother liver cancer - Mother   SOCIAL HISTORY: Marital Status: Married Current Smoking Status: Patient has never smoked.  Does not use smokeless tobacco. Does drink.  Does not use drugs. Drinks 1 caffeinated drink per day.     Notes: Caffeine Use, Previous History Of Smoking, Marital History - Divorced   REVIEW OF SYSTEMS:    GU Review Male:  Patient reports penile pain. Patient denies frequent urination, hard to postpone urination, burning/ pain with urination, get up at night to urinate, leakage of urine, stream starts and stops, trouble starting your stream, have to strain to urinate , and erection problems.   Gastrointestinal (Upper):  Patient denies nausea, vomiting, and indigestion/ heartburn.   Gastrointestinal (Lower):  Patient denies diarrhea and constipation.   Constitutional:  Patient denies fever, night sweats, weight  loss, and fatigue.   Skin:  Patient denies skin rash/ lesion and itching.   Eyes:  Patient denies blurred vision and double vision.   Ears/ Nose/ Throat:  Patient denies sore throat and sinus problems.   Hematologic/Lymphatic:  Patient denies swollen glands and easy bruising.   Cardiovascular:  Patient denies leg swelling and chest pains.   Respiratory:  Patient denies cough and shortness of breath.   Endocrine:  Patient denies excessive thirst.   Musculoskeletal:  Patient denies back pain and joint pain.   Neurological:   Patient denies dizziness and headaches.   Psychologic:  Patient denies depression and anxiety.   VITAL SIGNS:      07/24/2016 03:43 PM    BP 123/84 mmHg    Pulse 84 /min    Temperature 97.9 F / 37 C    GU PHYSICAL EXAMINATION:     Penis: The patient is partially circumcised, as an ulcerative lesion at the ventral aspect of the frenulum. There is some tunneling of the skin in this area as well. The glans penis is erythematous and tender.    MULTI-SYSTEM PHYSICAL EXAMINATION:     Constitutional: Well-nourished. No physical deformities. Normally developed. Good grooming.    Neck: Neck symmetrical, not swollen. Normal tracheal position.    Respiratory: No labored breathing, no use of accessory muscles.     Cardiovascular: Normal temperature, normal extremity pulses, no swelling, no varicosities.    Lymphatic: No enlargement of neck, axillae, groin.    Skin: No paleness, no jaundice, no cyanosis. No lesion, no ulcer, no rash.    Neurologic / Psychiatric: Oriented to time, oriented to place, oriented to person. No depression, no anxiety, no agitation.    Gastrointestinal: No mass, no tenderness, no rigidity, non obese abdomen.    Eyes: Normal conjunctivae. Normal eyelids.    Ears, Nose, Mouth, and Throat: Left ear no scars, no lesions, no masses. Right ear no scars, no lesions, no masses. Nose no scars, no lesions, no masses. Normal hearing. Normal lips.    Musculoskeletal: Normal gait and station of head and neck.          PAST DATA REVIEWED:  Source Of History:  Patient Records Review:  Previous Patient Records   06/21/09 05/01/09 01/09/09 11/01/08 PSA Total PSA 0.80  1.01  0.74  2.42    PROCEDURES:   Urinalysis w/Scope - 81001  Dipstick Dipstick Cont'd Micro Specimen: Voided Bilirubin: Neg WBC/hpf: NS (Not Seen) Color: Yellow Ketones: Neg RBC/hpf: 0-2/hpf Appearance: Cloudy Blood: Neg Bacteria: Rare Specific Gravity: 1.025 Protein: Neg Cystals: Ca Oxalate pH: 5.0 Urobilinogen:  1.0 Casts: NS (Not Seen) Glucose: Neg Nitrites: Neg Trichomonas: Not Present  Leukocyte Esterase: Neg Mucous: Not Present   Epithelial Cells: 0-5/hpf   Yeast: NS (Not Seen)   Sperm: Not Present   ASSESSMENT:    ICD-10 Details 1 GU:  Phimosis - N47.1 I think the patient is having issues with his foreskin and the frenulum tether. The area is inflamed and causing a tighter phimtotic band. I think the patient would benefit from a circumcision revision.  PLAN:  Document  Letter(s):  Created for Patient: Clinical Summary  Notes:  Our plan is to schedule the patient for circumcision. I discussed the surgery with the patient in quite a bit of detail. We discussed the risks and the benefits of this procedure. We'll go ahead and get this scheduled at the patient's convenience.

## 2016-08-06 NOTE — Anesthesia Postprocedure Evaluation (Signed)
Anesthesia Post Note  Patient: ARJENIS INDOVINA  Procedure(s) Performed: Procedure(s) (LRB): CIRCUMCISION ADULT (N/A)  Patient location during evaluation: PACU Anesthesia Type: MAC Level of consciousness: awake and alert Pain management: pain level controlled Vital Signs Assessment: post-procedure vital signs reviewed and stable Respiratory status: spontaneous breathing, nonlabored ventilation, respiratory function stable and patient connected to nasal cannula oxygen Cardiovascular status: stable and blood pressure returned to baseline Anesthetic complications: no    Last Vitals:  Vitals:   08/06/16 0945 08/06/16 1000  BP: 117/87 124/87  Pulse:  64  Resp:  20  Temp:      Last Pain:  Vitals:   08/06/16 0615  TempSrc: Oral                 Jahkeem Kurka DANIEL

## 2016-08-06 NOTE — Discharge Instructions (Signed)
Post Anesthesia Home Care Instructions  Activity: Get plenty of rest for the remainder of the day. A responsible adult should stay with you for 24 hours following the procedure.  For the next 24 hours, DO NOT: -Drive a car -Paediatric nurse -Drink alcoholic beverages -Take any medication unless instructed by your physician -Make any legal decisions or sign important papers.  Meals: Start with liquid foods such as gelatin or soup. Progress to regular foods as tolerated. Avoid greasy, spicy, heavy foods. If nausea and/or vomiting occur, drink only clear liquids until the nausea and/or vomiting subsides. Call your physician if vomiting continues.  Special Instructions/Symptoms: Your throat may feel dry or sore from the anesthesia or the breathing tube placed in your throat during surgery. If this causes discomfort, gargle with warm salt water. The discomfort should disappear within 24 hours.  If you had a scopolamine patch placed behind your ear for the management of post- operative nausea and/or vomiting:  1. The medication in the patch is effective for 72 hours, after which it should be removed.  Wrap patch in a tissue and discard in the trash. Wash hands thoroughly with soap and water. 2. You may remove the patch earlier than 72 hours if you experience unpleasant side effects which may include dry mouth, dizziness or visual disturbances. 3. Avoid touching the patch. Wash your hands with soap and water after contact with the patch.   Postoperative instructions for circumcision  Wound:  In most cases your incision will have absorbable sutures that run along the course of your incision and will dissolve within the first 10-20 days. Some will fall out even earlier. Expect some redness as the sutures dissolved but this should occur only around the sutures. If there is generalized redness, especially with increasing pain or swelling, let us know. The penis will very likely get "black and  blue" as the blood in the tissues spread. Sometimes the whole penis will turn colors. The black and blue is followed by a yellow and brown color. In time, all the discoloration will go away.  Diet:  You may return to your normal diet within 24 hours following your surgery. You may note some mild nausea and possibly vomiting the first 6-8 hours following surgery. This is usually due to the side effects of anesthesia, and will disappear quite soon. I would suggest clear liquids and a very light meal the first evening following your surgery.  Activity:  Your physical activity should be restricted the first 48 hours. During that time you should remain relatively inactive, moving about only when necessary. During the first 7-10 days following surgery he should avoid lifting any heavy objects (anything greater than 15 pounds), and avoid strenuous exercise. If you work, ask [place] specifically about your restrictions, both for work and home. We will write a note to your employer if needed.  Ice packs can be placed on and off over the penis for the first 48 hours to help relieve the pain and keep the swelling down. Frozen peas or corn in a ZipLock bag can be frozen, used and re-frozen. Fifteen minutes on and 15 minutes off is a reasonable schedule.   Hygiene:  You may shower 48 hours after your surgery. Tub bathing should be restricted until the seventh day.  Medication:  You will be sent home with some type of pain medication. In many cases you will be sent home with a narcotic pain pill (Vicodin or Tylox). If the pain is not too bad,  you may take either Tylenol (acetaminophen) or Advil (ibuprofen) which contain no narcotic agents, and might be tolerated a little better, with fewer side effects. If the pain medication you are sent home with does not control the pain, you will have to let us know. Some narcotic pain medications cannot be given or refilled by a phone call to a pharmacy.  Problems you should  report to [place]:   Fever of 101.0 degrees Fahrenheit or greater.  Moderate or severe swelling under the skin incision or involving the scrotum.  Drug reaction such as hives, a rash, nausea or vomiting.

## 2016-08-06 NOTE — Transfer of Care (Signed)
Immediate Anesthesia Transfer of Care Note  Patient: [name]  Procedure(s) Performed: Procedure(s): CIRCUMCISION ADULT (N/A)  Patient Location: PACU  Anesthesia Type:MAC  Level of Consciousness: awake, alert  and oriented  Airway & Oxygen Therapy: Patient Spontanous Breathing and Patient connected to face mask oxygen  Post-op Assessment: Report given to RN and Post -op Vital signs reviewed and stable  Post vital signs: Reviewed and stable  Last Vitals:  Vitals:   08/06/16 0615  BP: 128/83  Pulse: 62  Resp: 16  Temp: 36.5 C    Last Pain:  Vitals:   08/06/16 0615  TempSrc: Oral      Patients Stated Pain Goal: 8 ([phone] [id number])  Complications: No apparent anesthesia complications

## 2016-08-07 ENCOUNTER — Encounter (HOSPITAL_BASED_OUTPATIENT_CLINIC_OR_DEPARTMENT_OTHER): Payer: Self-pay | Admitting: Urology

## 2016-08-07 NOTE — Op Note (Signed)
Preoperative diagnosis:  1. phimosis   Postoperative diagnosis:  1. same   Procedure: 1. circumcision  Surgeon: Ardis Hughs, MD  Anesthesia: MAC with penile block - 50/50 mix of 0.5% 0000000 lidocaine  Complications: None  Intraoperative findings: 1cm of foreskin removed.  Tissue was inflammed and bleed more than expected.  Bleeding stopped priro to conclusion of the case.  EBL: 25cc  Specimens: None  Indication: [name] is a 65 y.o. patient with chronic infection of foreskin.  After reviewing the management options for treatment, he elected to proceed with the above surgical procedure(s). We have discussed the potential benefits and risks of the procedure, side effects of the proposed treatment, the likelihood of the patient achieving the goals of the procedure, and any potential problems that might occur during the procedure or recuperation. Informed consent has been obtained.  Description of procedure:  After  anesthesia was induced the patient was prepped and draped in the routine sterile fashion. A penile block was then performed with local anesthetic. The foreskin was then opened enough to allow retraction over the glans penis. The prepuce was then marked so as to provide a proximately 5 mm collar.  The foreskin was then reduced over the glans penis and marked so as to allow a nice fit for the reanastomosis of the skin to the collar.  A 15 blade was then used to make a circumferential incision through the dermis of both marks. A pair of Metzenbaum scissors was then tunneled through the incisions and the foreskin opened on top of the scissors using electrocautery. The foreskin was then removed circumferentially. The underlying tissue was then cauterized and all bleeding stopped.  The penile skin was then approximated to the collar in 4 quadrants using a 3-0 Vicryl. The frenulum was attached with a U stitch in the dorsal surface. The remaining skin was then closed  in a running baseball stitch in 4 separate sutures.  Vaseline impregnated dressing was then applied to the suture line in the penis was wrapped with 2 inch Kerlix dressing gently. The patient was then given 12 dressing and a pair of mesh underpants. The patient was subsequently awoken and returned to PACU in excellent condition. There no immediate complications.

## 2016-08-14 DIAGNOSIS — Z86010 Personal history of colonic polyps: Secondary | ICD-10-CM | POA: Diagnosis not present

## 2016-08-20 DIAGNOSIS — N471 Phimosis: Secondary | ICD-10-CM | POA: Diagnosis not present

## 2016-09-17 MED FILL — SIMVASTATIN 40 MG TABLET: 40 | 90 days supply | Qty: 90 | Fill #3

## 2016-10-06 DIAGNOSIS — E789 Disorder of lipoprotein metabolism, unspecified: Secondary | ICD-10-CM | POA: Diagnosis not present

## 2016-10-06 DIAGNOSIS — Z125 Encounter for screening for malignant neoplasm of prostate: Secondary | ICD-10-CM | POA: Diagnosis not present

## 2016-10-06 DIAGNOSIS — Z0000 Encounter for general adult medical examination without abnormal findings: Secondary | ICD-10-CM | POA: Diagnosis not present

## 2016-10-06 DIAGNOSIS — E291 Testicular hypofunction: Secondary | ICD-10-CM | POA: Diagnosis not present

## 2016-10-13 DIAGNOSIS — E789 Disorder of lipoprotein metabolism, unspecified: Secondary | ICD-10-CM | POA: Diagnosis not present

## 2016-10-13 DIAGNOSIS — R05 Cough: Secondary | ICD-10-CM | POA: Diagnosis not present

## 2016-10-13 DIAGNOSIS — E291 Testicular hypofunction: Secondary | ICD-10-CM | POA: Diagnosis not present

## 2016-10-14 MED FILL — FAMCICLOVIR 250 MG TABLET: 250 | 90 days supply | Qty: 90 | Fill #0

## 2016-12-21 MED FILL — SIMVASTATIN 40 MG TABLET: 40 | 90 days supply | Qty: 90 | Fill #0

## 2017-01-12 MED FILL — FAMCICLOVIR 250 MG TABLET: 250 | 90 days supply | Qty: 90 | Fill #1

## 2017-01-21 DIAGNOSIS — H524 Presbyopia: Secondary | ICD-10-CM | POA: Diagnosis not present

## 2017-01-21 DIAGNOSIS — H5203 Hypermetropia, bilateral: Secondary | ICD-10-CM | POA: Diagnosis not present

## 2017-01-21 DIAGNOSIS — H2513 Age-related nuclear cataract, bilateral: Secondary | ICD-10-CM | POA: Diagnosis not present

## 2017-01-21 DIAGNOSIS — H52223 Regular astigmatism, bilateral: Secondary | ICD-10-CM | POA: Diagnosis not present

## 2017-03-22 MED FILL — SIMVASTATIN 40 MG TABLET: 40 | 90 days supply | Qty: 90 | Fill #1

## 2017-04-06 DIAGNOSIS — E291 Testicular hypofunction: Secondary | ICD-10-CM | POA: Diagnosis not present

## 2017-04-06 DIAGNOSIS — Z125 Encounter for screening for malignant neoplasm of prostate: Secondary | ICD-10-CM | POA: Diagnosis not present

## 2017-04-06 DIAGNOSIS — E789 Disorder of lipoprotein metabolism, unspecified: Secondary | ICD-10-CM | POA: Diagnosis not present

## 2017-04-13 DIAGNOSIS — E789 Disorder of lipoprotein metabolism, unspecified: Secondary | ICD-10-CM | POA: Diagnosis not present

## 2017-04-13 DIAGNOSIS — E291 Testicular hypofunction: Secondary | ICD-10-CM | POA: Diagnosis not present

## 2017-04-26 MED FILL — FAMCICLOVIR 250 MG TABLET: 250 | 90 days supply | Qty: 90 | Fill #2

## 2017-06-07 MED FILL — SIMVASTATIN 40 MG TABLET: 40 | 90 days supply | Qty: 90 | Fill #2

## 2017-07-21 MED FILL — FAMCICLOVIR 250 MG TABLET: 250 | 90 days supply | Qty: 90 | Fill #3

## 2017-07-22 MED FILL — ANDROGEL 1.62% GEL PUMP: 20.25 MG/AC | 60 days supply | Qty: 75 | Fill #0

## 2017-09-14 MED FILL — SIMVASTATIN 40 MG TABLET: 40 | 90 days supply | Qty: 90 | Fill #3

## 2017-09-16 DIAGNOSIS — S60351A Superficial foreign body of right thumb, initial encounter: Secondary | ICD-10-CM | POA: Diagnosis not present

## 2017-09-16 DIAGNOSIS — M79644 Pain in right finger(s): Secondary | ICD-10-CM | POA: Diagnosis not present

## 2017-09-16 MED FILL — SULFAMETHOXAZOLE/TMP DS TAB: 800-160 | 7 days supply | Qty: 14 | Fill #0

## 2017-09-20 MED FILL — TESTOSTERONE 20.25 MG/ACT (: 20.25 MG/AC | 60 days supply | Qty: 75 | Fill #1

## 2017-10-12 DIAGNOSIS — Z0000 Encounter for general adult medical examination without abnormal findings: Secondary | ICD-10-CM | POA: Diagnosis not present

## 2017-10-12 DIAGNOSIS — Z125 Encounter for screening for malignant neoplasm of prostate: Secondary | ICD-10-CM | POA: Diagnosis not present

## 2017-10-12 DIAGNOSIS — E789 Disorder of lipoprotein metabolism, unspecified: Secondary | ICD-10-CM | POA: Diagnosis not present

## 2017-10-15 DIAGNOSIS — Z23 Encounter for immunization: Secondary | ICD-10-CM | POA: Diagnosis not present

## 2017-10-15 DIAGNOSIS — Z0000 Encounter for general adult medical examination without abnormal findings: Secondary | ICD-10-CM | POA: Diagnosis not present

## 2017-10-15 DIAGNOSIS — E291 Testicular hypofunction: Secondary | ICD-10-CM | POA: Diagnosis not present

## 2017-10-15 DIAGNOSIS — E7800 Pure hypercholesterolemia, unspecified: Secondary | ICD-10-CM | POA: Diagnosis not present

## 2017-10-15 MED FILL — FAMCICLOVIR 250 MG TABLET: 250 | 30 days supply | Qty: 30 | Fill #0

## 2017-11-29 MED FILL — FAMCICLOVIR 250 MG TABLET: 250 | 30 days supply | Qty: 30 | Fill #1

## 2017-12-06 MED FILL — TESTOSTERONE 20.25 MG/ACT (: 20.25 MG/AC | 60 days supply | Qty: 75 | Fill #0

## 2017-12-06 MED FILL — SIMVASTATIN 40 MG TABLET: 40 | 90 days supply | Qty: 90 | Fill #0

## 2017-12-15 DIAGNOSIS — Z6836 Body mass index (BMI) 36.0-36.9, adult: Secondary | ICD-10-CM | POA: Diagnosis not present

## 2017-12-15 DIAGNOSIS — Z1331 Encounter for screening for depression: Secondary | ICD-10-CM | POA: Diagnosis not present

## 2017-12-15 DIAGNOSIS — E7800 Pure hypercholesterolemia, unspecified: Secondary | ICD-10-CM | POA: Diagnosis not present

## 2017-12-15 DIAGNOSIS — A6000 Herpesviral infection of urogenital system, unspecified: Secondary | ICD-10-CM | POA: Diagnosis not present

## 2017-12-15 DIAGNOSIS — Z9181 History of falling: Secondary | ICD-10-CM | POA: Diagnosis not present

## 2017-12-15 DIAGNOSIS — E349 Endocrine disorder, unspecified: Secondary | ICD-10-CM | POA: Diagnosis not present

## 2017-12-27 MED FILL — FAMCICLOVIR 250 MG TABLET: 250 | 30 days supply | Qty: 30 | Fill #2

## 2018-01-28 MED FILL — FAMCICLOVIR 250 MG TABLET: 250 | 30 days supply | Qty: 30 | Fill #3

## 2018-02-04 DIAGNOSIS — H25813 Combined forms of age-related cataract, bilateral: Secondary | ICD-10-CM | POA: Diagnosis not present

## 2018-02-04 DIAGNOSIS — H524 Presbyopia: Secondary | ICD-10-CM | POA: Diagnosis not present

## 2018-02-04 DIAGNOSIS — H52223 Regular astigmatism, bilateral: Secondary | ICD-10-CM | POA: Diagnosis not present

## 2018-02-04 DIAGNOSIS — H43811 Vitreous degeneration, right eye: Secondary | ICD-10-CM | POA: Diagnosis not present

## 2018-02-04 DIAGNOSIS — H43391 Other vitreous opacities, right eye: Secondary | ICD-10-CM | POA: Diagnosis not present

## 2018-02-04 DIAGNOSIS — H5203 Hypermetropia, bilateral: Secondary | ICD-10-CM | POA: Diagnosis not present

## 2018-02-17 ENCOUNTER — Ambulatory Visit (INDEPENDENT_AMBULATORY_CARE_PROVIDER_SITE_OTHER): Payer: 59 | Admitting: Orthopedic Surgery

## 2018-02-17 ENCOUNTER — Encounter (INDEPENDENT_AMBULATORY_CARE_PROVIDER_SITE_OTHER): Payer: Self-pay | Admitting: Orthopedic Surgery

## 2018-02-17 ENCOUNTER — Ambulatory Visit (INDEPENDENT_AMBULATORY_CARE_PROVIDER_SITE_OTHER): Payer: 59

## 2018-02-17 DIAGNOSIS — G8929 Other chronic pain: Secondary | ICD-10-CM | POA: Diagnosis not present

## 2018-02-17 DIAGNOSIS — M25562 Pain in left knee: Secondary | ICD-10-CM

## 2018-02-17 DIAGNOSIS — M1712 Unilateral primary osteoarthritis, left knee: Secondary | ICD-10-CM | POA: Diagnosis not present

## 2018-02-19 ENCOUNTER — Encounter (INDEPENDENT_AMBULATORY_CARE_PROVIDER_SITE_OTHER): Payer: Self-pay | Admitting: Orthopedic Surgery

## 2018-02-19 DIAGNOSIS — G8929 Other chronic pain: Secondary | ICD-10-CM | POA: Diagnosis not present

## 2018-02-19 DIAGNOSIS — M1712 Unilateral primary osteoarthritis, left knee: Secondary | ICD-10-CM | POA: Diagnosis not present

## 2018-02-19 DIAGNOSIS — M25562 Pain in left knee: Secondary | ICD-10-CM | POA: Diagnosis not present

## 2018-02-19 MED ORDER — LIDOCAINE HCL 1 % IJ SOLN
5.0000 mL | INTRAMUSCULAR | Status: AC | PRN
  Administered 2018-02-19: 5 mL

## 2018-02-19 MED ORDER — METHYLPREDNISOLONE ACETATE 40 MG/ML IJ SUSP
40.0000 mg | INTRAMUSCULAR | Status: AC | PRN
  Administered 2018-02-19: 40 mg via INTRA_ARTICULAR

## 2018-02-19 MED ORDER — BUPIVACAINE HCL 0.25 % IJ SOLN
4.0000 mL | INTRAMUSCULAR | Status: AC | PRN
  Administered 2018-02-19: 4 mL via INTRA_ARTICULAR

## 2018-02-19 NOTE — Progress Notes (Signed)
Office Visit Note   Patient: [name]           Date of Birth: 03-13-1951           MRN: [id number] Visit Date: 02/17/2018 Requested by: Anda Kraft, MD [address] [place], [place] [address] PCP: Anda Kraft, MD  Subjective: Chief Complaint  Patient presents with  . Left Knee - Pain    HPI: [name] is a 67 year old patient with left knee pain.  He underwent ACL reconstruction and medial meniscal repair about 20 years ago.  Had repeat arthroscopy 10 years ago.  Reports increasing pain over the last 6 months.  Takes Aleve daily.  Describes swelling weakness and giving way but no discrete mechanical symptoms.  He does not really report any symptomatic instability in the left knee.  He does walk about a mile and a half a day.  He states that lateral movement is difficult.  He states that the medial sided pain is the worst.  Denies any symptoms in the right knee.  The pain does not wake him from sleep at night.  He does take medication for reflux.              ROS: All systems reviewed are negative as they relate to the chief complaint within the history of present illness.  Patient denies  fevers or chills.   Assessment & Plan: Visit Diagnoses:  1. Chronic pain of left knee   2. Unilateral primary osteoarthritis, left knee     Plan: Impression is left knee medial compartment arthritis and pain with stable knee and possible moderate patellofemoral arthritis as well.  Interestingly he has moderate to severe medial compartment arthritis in the right knee as well but is completely asymptomatic on that side.  Plan at this time is injection of that left knee with cortisone.  We will preapproved him for Synvisc.  He is likely heading for knee replacement at some time in the future.  Whether that is partial or complete knee replacement really depends on the amount of patellofemoral and lateral compartment arthritis present.  I will see him back for gel injection once this  cortisone wears off.  Follow-Up Instructions: No follow-ups on file.   Orders:  Orders Placed This Encounter  Procedures  . XR KNEE 3 VIEW LEFT   No orders of the defined types were placed in this encounter.     Procedures: Large Joint Inj: L knee on 02/19/2018 8:42 AM Indications: diagnostic evaluation, joint swelling and pain Details: 18 G 1.5 in needle, superolateral approach  Arthrogram: No  Medications: 5 mL lidocaine 1 %; 40 mg methylPREDNISolone acetate 40 MG/ML; 4 mL bupivacaine 0.25 % Outcome: tolerated well, no immediate complications Procedure, treatment alternatives, risks and benefits explained, specific risks discussed. Consent was given by the patient. Immediately prior to procedure a time out was called to verify the correct patient, procedure, equipment, support staff and site/side marked as required. Patient was prepped and draped in the usual sterile fashion.       Clinical Data: No additional findings.  Objective: Vital Signs: There were no vitals taken for this visit.  Physical Exam:   Constitutional: Patient appears well-developed HEENT:  Head: Normocephalic Eyes:EOM are normal Neck: Normal range of motion Cardiovascular: Normal rate Pulmonary/chest: Effort normal Neurologic: Patient is alert Skin: Skin is warm Psychiatric: Patient has normal mood and affect    Ortho Exam: Orthopedic exam demonstrates well-healed surgical incision in the left knee.  Trace  effusion is present.  Range of motion is actually full extension to full flexion on the left-hand side and right-hand side.  Patient has medial greater than lateral joint line tenderness.  ACL graft feels stable on the left knee.  Slightly more patellofemoral crepitus present on the left compared to the right.  Specialty Comments:  No specialty comments available.  Imaging: No results found.   PMFS History: Patient Active Problem List   Diagnosis Date Noted  . Infected cyst of skin  08/19/2012    Class: Chronic   Past Medical History:  Diagnosis Date  . GERD (gastroesophageal reflux disease)   . History of adenomatous polyp of colon    2004  hyperplastic and tubular adenoma/  2012 hyperplastic  . History of aspiration pneumonitis    03/ 2015  . HSV-1 (herpes simplex virus 1) infection    chronic fever blister's  . Hyperlipidemia   . Phimosis   . Snores    wears snore night gaurd /  per pt no sleep study  . Wears glasses     History reviewed. No pertinent family history.  Past Surgical History:  Procedure Laterality Date  . ARTHROSCOPIC REPAIR ACL Left 1992  . CIRCUMCISION N/A 08/06/2016   Procedure: CIRCUMCISION ADULT;  Surgeon: Ardis Hughs, MD;  Location[facility];  Service: Urology;  Laterality: N/A;  . COLONOSCOPY  last one 05-29-2011  . EAR CYST EXCISION  08/19/2012   Procedure: CYST REMOVAL;  Surgeon: Jerrell Belfast, MD;  Location: [facility];  Service: ENT;  Laterality: Left;  excision of left  infra auricular cyst   . INGUINAL HERNIA REPAIR Right 1980's  . KNEE ARTHROSCOPY Left 04/22/2006   Social History   Occupational History  . Not on file  Tobacco Use  . Smoking status: Never Smoker  . Smokeless tobacco: Never Used  Substance and Sexual Activity  . Alcohol use: No  . Drug use: No  . Sexual activity: Yes

## 2018-02-23 ENCOUNTER — Telehealth (INDEPENDENT_AMBULATORY_CARE_PROVIDER_SITE_OTHER): Payer: Self-pay

## 2018-02-23 NOTE — Telephone Encounter (Signed)
Submitted application online for SynviscOne injection, left knee.  

## 2018-02-25 MED FILL — FAMCICLOVIR 250 MG TABLET: 250 | 30 days supply | Qty: 30 | Fill #4

## 2018-03-07 MED FILL — TESTOSTERONE 20.25 MG/ACT (: 20.25 MG/AC | 60 days supply | Qty: 75 | Fill #1

## 2018-03-14 MED FILL — SIMVASTATIN 40 MG TABLET: 40 | 30 days supply | Qty: 30 | Fill #1

## 2018-03-28 MED FILL — FAMCICLOVIR 250 MG TABLET: 250 | 30 days supply | Qty: 30 | Fill #5

## 2018-03-31 ENCOUNTER — Telehealth (INDEPENDENT_AMBULATORY_CARE_PROVIDER_SITE_OTHER): Payer: Self-pay | Admitting: Orthopedic Surgery

## 2018-03-31 ENCOUNTER — Telehealth (INDEPENDENT_AMBULATORY_CARE_PROVIDER_SITE_OTHER): Payer: Self-pay

## 2018-03-31 NOTE — Telephone Encounter (Signed)
Patient called wanting to receive a gel injection, him and Dr. Marlou Sa discussed it at the last appointment and he was checking on the status of that. CB # [phone]

## 2018-03-31 NOTE — Telephone Encounter (Signed)
Talked with patient and advised him that he is approved for SynviscOne, injection, left knee.  Covered at 60% after deductible has been met. Buy & Bill No PA Required.

## 2018-03-31 NOTE — Telephone Encounter (Signed)
Talked with patient and advised him that he is approved to have SynviscOne injection, left knee.  Appointment scheduled.

## 2018-04-07 ENCOUNTER — Encounter (INDEPENDENT_AMBULATORY_CARE_PROVIDER_SITE_OTHER): Payer: Self-pay | Admitting: Orthopedic Surgery

## 2018-04-07 ENCOUNTER — Ambulatory Visit (INDEPENDENT_AMBULATORY_CARE_PROVIDER_SITE_OTHER): Payer: 59 | Admitting: Orthopedic Surgery

## 2018-04-07 DIAGNOSIS — M1712 Unilateral primary osteoarthritis, left knee: Secondary | ICD-10-CM | POA: Diagnosis not present

## 2018-04-07 MED ORDER — LIDOCAINE HCL 1 % IJ SOLN
5.0000 mL | INTRAMUSCULAR | Status: AC | PRN
  Administered 2018-04-07: 5 mL

## 2018-04-07 MED ORDER — HYLAN G-F 20 48 MG/6ML IX SOSY
48.0000 mg | PREFILLED_SYRINGE | INTRA_ARTICULAR | Status: AC | PRN
  Administered 2018-04-07: 48 mg via INTRA_ARTICULAR

## 2018-04-07 NOTE — Progress Notes (Signed)
   Procedure Note  Patient: [name]             Date of Birth: Jan 18, 1951           MRN: [id number]             Visit Date: 04/07/2018  Procedures: Visit Diagnoses: Unilateral primary osteoarthritis, left knee  Large Joint Inj: L knee on 04/07/2018 10:31 AM Indications: pain, joint swelling and diagnostic evaluation Details: 18 G 1.5 in needle, superolateral approach  Arthrogram: No  Medications: 5 mL lidocaine 1 %; 48 mg Hylan 48 MG/6ML Outcome: tolerated well, no immediate complications Procedure, treatment alternatives, risks and benefits explained, specific risks discussed. Consent was given by the patient. Immediately prior to procedure a time out was called to verify the correct patient, procedure, equipment, support staff and site/side marked as required. Patient was prepped and draped in the usual sterile fashion.

## 2018-04-12 MED FILL — SIMVASTATIN 40 MG TABLET: 40 | 90 days supply | Qty: 90 | Fill #0

## 2018-04-25 MED FILL — FAMCICLOVIR 250 MG TABLET: 250 | 30 days supply | Qty: 30 | Fill #6

## 2018-05-20 MED FILL — TESTOSTERONE 20.25 MG/ACT (: 20.25 MG/AC | 60 days supply | Qty: 75 | Fill #2

## 2018-05-24 MED FILL — FAMCICLOVIR 250 MG TABLET: 250 | 90 days supply | Qty: 90 | Fill #0

## 2018-05-30 MED FILL — TOBRAMYCIN-DEXAMETH OPTH SU: 0.3-0.1 | 10 days supply | Qty: 5 | Fill #0

## 2018-05-30 MED FILL — DOXYCYCLINE HYCLATE 100 MG: 100 | 10 days supply | Qty: 20 | Fill #0

## 2018-06-01 ENCOUNTER — Encounter (INDEPENDENT_AMBULATORY_CARE_PROVIDER_SITE_OTHER): Payer: Self-pay | Admitting: Orthopedic Surgery

## 2018-06-01 ENCOUNTER — Telehealth (INDEPENDENT_AMBULATORY_CARE_PROVIDER_SITE_OTHER): Payer: Self-pay

## 2018-06-01 ENCOUNTER — Ambulatory Visit (INDEPENDENT_AMBULATORY_CARE_PROVIDER_SITE_OTHER): Payer: 59 | Admitting: Orthopedic Surgery

## 2018-06-01 DIAGNOSIS — M1712 Unilateral primary osteoarthritis, left knee: Secondary | ICD-10-CM | POA: Diagnosis not present

## 2018-06-01 MED ORDER — BUPIVACAINE HCL 0.25 % IJ SOLN
4.0000 mL | INTRAMUSCULAR | Status: AC | PRN
  Administered 2018-06-01: 4 mL via INTRA_ARTICULAR

## 2018-06-01 MED ORDER — METHYLPREDNISOLONE ACETATE 40 MG/ML IJ SUSP
40.0000 mg | INTRAMUSCULAR | Status: AC | PRN
  Administered 2018-06-01: 40 mg via INTRA_ARTICULAR

## 2018-06-01 MED ORDER — LIDOCAINE HCL 1 % IJ SOLN
5.0000 mL | INTRAMUSCULAR | Status: AC | PRN
  Administered 2018-06-01: 5 mL

## 2018-06-01 NOTE — Telephone Encounter (Signed)
Can we get patient pre approved for repeat gel injection for his left knee after 10/07/18?

## 2018-06-01 NOTE — Progress Notes (Signed)
Office Visit Note   Patient: [name]           Date of Birth: [birth year]           MRN: [id number] Visit Date: 06/01/2018 Requested by: Anda Kraft, MD [address] [place], [place] [address] PCP: [facility]  Subjective: Chief Complaint  Patient presents with  . Left Knee - Follow-up    HPI: Hassell Done is a 67 year old patient with left knee arthritis.  He is having some mildly increased pain.  Going to [place] in the near future.  Would like to have his knee feeling good for that trip.  Last had gel injection in June.              ROS: All systems reviewed are negative as they relate to the chief complaint within the history of present illness.  Patient denies  fevers or chills.   Assessment & Plan: Visit Diagnoses: No diagnosis found.  Plan: Impression is left knee arthritis.  Plan is cortisone injection today.  That will be 2 cortisone shots this year.  He may need knee replacement at some time in the future but at this point in time he is doing reasonably well.  We will see him back as needed  Follow-Up Instructions: No follow-ups on file.   Orders:  No orders of the defined types were placed in this encounter.  No orders of the defined types were placed in this encounter.     Procedures: Large Joint Inj: L knee on 06/01/2018 8:47 AM Indications: diagnostic evaluation, joint swelling and pain Details: 18 G 1.5 in needle, superolateral approach  Arthrogram: No  Medications: 5 mL lidocaine 1 %; 40 mg methylPREDNISolone acetate 40 MG/ML; 4 mL bupivacaine 0.25 % Outcome: tolerated well, no immediate complications Procedure, treatment alternatives, risks and benefits explained, specific risks discussed. Consent was given by the patient. Immediately prior to procedure a time out was called to verify the correct patient, procedure, equipment, support staff and site/side marked as required. Patient was prepped and draped in the usual  sterile fashion.       Clinical Data: No additional findings.  Objective: Vital Signs: There were no vitals taken for this visit.  Physical Exam:   Constitutional: Patient appears well-developed HEENT:  Head: Normocephalic Eyes:EOM are normal Neck: Normal range of motion Cardiovascular: Normal rate Pulmonary/chest: Effort normal Neurologic: Patient is alert Skin: Skin is warm Psychiatric: Patient has normal mood and affect    Ortho Exam: Ortho exam demonstrates no effusion and good range of motion.  Slightly antalgic gait to the left.  Quad strength is reasonable.  Specialty Comments:  No specialty comments available.  Imaging: No results found.   PMFS History: Patient Active Problem List   Diagnosis Date Noted  . Infected cyst of skin 08/19/2012    Class: Chronic   Past Medical History:  Diagnosis Date  . GERD (gastroesophageal reflux disease)   . History of adenomatous polyp of colon    2004  hyperplastic and tubular adenoma/  2012 hyperplastic  . History of aspiration pneumonitis    03/ 2015  . HSV-1 (herpes simplex virus 1) infection    chronic fever blister's  . Hyperlipidemia   . Phimosis   . Snores    wears snore night gaurd /  per pt no sleep study  . Wears glasses     History reviewed. No pertinent family history.  Past Surgical History:  Procedure Laterality Date  .  ARTHROSCOPIC REPAIR ACL Left 1992  . CIRCUMCISION N/A 08/06/2016   Procedure: CIRCUMCISION ADULT;  Surgeon: Ardis Hughs, MD;  Location: [facility];  Service: Urology;  Laterality: N/A;  . COLONOSCOPY  last one 05-29-2011  . EAR CYST EXCISION  08/19/2012   Procedure: CYST REMOVAL;  Surgeon: Jerrell Belfast, MD;  Location: [facility];  Service: ENT;  Laterality: Left;  excision of left  infra auricular cyst   . INGUINAL HERNIA REPAIR Right 1980's  . KNEE ARTHROSCOPY Left 04/22/2006   Social History   Occupational History  . Not on file    Tobacco Use  . Smoking status: Never Smoker  . Smokeless tobacco: Never Used  Substance and Sexual Activity  . Alcohol use: No  . Drug use: No  . Sexual activity: Yes

## 2018-06-09 ENCOUNTER — Telehealth (INDEPENDENT_AMBULATORY_CARE_PROVIDER_SITE_OTHER): Payer: Self-pay

## 2018-06-09 NOTE — Telephone Encounter (Signed)
Noted  

## 2018-06-09 NOTE — Telephone Encounter (Signed)
Submitted for VOB, Monovisc, left knee. 

## 2018-06-21 DIAGNOSIS — E7800 Pure hypercholesterolemia, unspecified: Secondary | ICD-10-CM | POA: Diagnosis not present

## 2018-06-21 DIAGNOSIS — Z125 Encounter for screening for malignant neoplasm of prostate: Secondary | ICD-10-CM | POA: Diagnosis not present

## 2018-06-21 DIAGNOSIS — Z79899 Other long term (current) drug therapy: Secondary | ICD-10-CM | POA: Diagnosis not present

## 2018-06-21 DIAGNOSIS — E349 Endocrine disorder, unspecified: Secondary | ICD-10-CM | POA: Diagnosis not present

## 2018-06-23 ENCOUNTER — Telehealth (INDEPENDENT_AMBULATORY_CARE_PROVIDER_SITE_OTHER): Payer: Self-pay

## 2018-06-23 DIAGNOSIS — A6000 Herpesviral infection of urogenital system, unspecified: Secondary | ICD-10-CM | POA: Diagnosis not present

## 2018-06-23 DIAGNOSIS — Z23 Encounter for immunization: Secondary | ICD-10-CM | POA: Diagnosis not present

## 2018-06-23 DIAGNOSIS — E349 Endocrine disorder, unspecified: Secondary | ICD-10-CM | POA: Diagnosis not present

## 2018-06-23 DIAGNOSIS — E7800 Pure hypercholesterolemia, unspecified: Secondary | ICD-10-CM | POA: Diagnosis not present

## 2018-06-23 DIAGNOSIS — Z6836 Body mass index (BMI) 36.0-36.9, adult: Secondary | ICD-10-CM | POA: Diagnosis not present

## 2018-06-23 NOTE — Telephone Encounter (Signed)
Pre-Determination is required for [id number]-Monovisc, left knee. Faxed office notes to [facility] at [phone]

## 2018-07-07 MED FILL — TESTOSTERONE 20.25 MG/ACT (: 20.25 MG/AC | 30 days supply | Qty: 75 | Fill #0

## 2018-07-11 MED FILL — SIMVASTATIN 40 MG TABLET: 40 | 90 days supply | Qty: 90 | Fill #1

## 2018-08-04 MED FILL — TESTOSTERONE 20.25 MG/ACT (: 20.25 MG/AC | 30 days supply | Qty: 75 | Fill #1

## 2018-08-10 ENCOUNTER — Telehealth (INDEPENDENT_AMBULATORY_CARE_PROVIDER_SITE_OTHER): Payer: Self-pay

## 2018-08-10 NOTE — Telephone Encounter (Signed)
Talked with Derrick Ravel [facility] and was advised that no Pre-determination is required for [phone] [facility]).

## 2018-08-11 ENCOUNTER — Telehealth (INDEPENDENT_AMBULATORY_CARE_PROVIDER_SITE_OTHER): Payer: Self-pay

## 2018-08-11 NOTE — Telephone Encounter (Signed)
Patient is approved for Monovisc, left knee. [place] Patient will be responsible for 20% of the allowable amount. Per [place], no pre-determination is required for [phone]. No Co-pay  Appt. 10/19/2018 with Dr. Marlou Sa

## 2018-08-29 MED FILL — FAMCICLOVIR 250 MG TABLET: 250 | 90 days supply | Qty: 90 | Fill #1

## 2018-09-01 MED FILL — TESTOSTERONE 20.25 MG/ACT (: 20.25 MG/AC | 30 days supply | Qty: 75 | Fill #2

## 2018-10-04 MED FILL — TESTOSTERONE 20.25 MG/ACT (: 20.25 MG/AC | 30 days supply | Qty: 75 | Fill #3

## 2018-10-13 MED FILL — SIMVASTATIN 40 MG TABLET: 40 | 90 days supply | Qty: 90 | Fill #0

## 2018-10-19 ENCOUNTER — Ambulatory Visit (INDEPENDENT_AMBULATORY_CARE_PROVIDER_SITE_OTHER): Payer: 59 | Admitting: Orthopedic Surgery

## 2018-10-19 ENCOUNTER — Encounter (INDEPENDENT_AMBULATORY_CARE_PROVIDER_SITE_OTHER): Payer: Self-pay | Admitting: Orthopedic Surgery

## 2018-10-19 ENCOUNTER — Telehealth (INDEPENDENT_AMBULATORY_CARE_PROVIDER_SITE_OTHER): Payer: Self-pay

## 2018-10-19 DIAGNOSIS — M1712 Unilateral primary osteoarthritis, left knee: Secondary | ICD-10-CM

## 2018-10-19 NOTE — Telephone Encounter (Signed)
Please submit Gel injection for patient left knee in 6 months.----Dr Marlou Sa.  Monovisc injection given today.

## 2018-10-20 NOTE — Telephone Encounter (Signed)
Noted  

## 2018-10-25 ENCOUNTER — Encounter (INDEPENDENT_AMBULATORY_CARE_PROVIDER_SITE_OTHER): Payer: Self-pay | Admitting: Orthopedic Surgery

## 2018-10-25 DIAGNOSIS — M1712 Unilateral primary osteoarthritis, left knee: Secondary | ICD-10-CM | POA: Diagnosis not present

## 2018-10-25 MED ORDER — LIDOCAINE HCL 1 % IJ SOLN
5.0000 mL | INTRAMUSCULAR | Status: AC | PRN
  Administered 2018-10-25: 5 mL

## 2018-10-25 MED ORDER — HYALURONAN 88 MG/4ML IX SOSY
88.0000 mg | PREFILLED_SYRINGE | INTRA_ARTICULAR | Status: AC | PRN
  Administered 2018-10-25: 88 mg via INTRA_ARTICULAR

## 2018-10-25 NOTE — Progress Notes (Signed)
   Procedure Note  Patient: [name]             Date of Birth: [birth year]           MRN: [id number]             Visit Date: 10/19/2018  Procedures: Visit Diagnoses: Unilateral primary osteoarthritis, left knee  Large Joint Inj: L knee on 10/25/2018 9:22 AM Indications: pain, joint swelling and diagnostic evaluation Details: 18 G 1.5 in needle, superolateral approach  Arthrogram: No  Medications: 5 mL lidocaine 1 %; 88 mg Hyaluronan 88 MG/4ML Outcome: tolerated well, no immediate complications Procedure, treatment alternatives, risks and benefits explained, specific risks discussed. Consent was given by the patient. Immediately prior to procedure a time out was called to verify the correct patient, procedure, equipment, support staff and site/side marked as required. Patient was prepped and draped in the usual sterile fashion.

## 2018-11-03 MED FILL — TESTOSTERONE 20.25 MG/ACT (: 20.25 MG/AC | 30 days supply | Qty: 75 | Fill #4

## 2018-11-28 MED FILL — FAMCICLOVIR 250 MG TABLET: 250 | 90 days supply | Qty: 90 | Fill #2

## 2018-12-01 MED FILL — TESTOSTERONE 20.25 MG/ACT (: 20.25 MG/AC | 30 days supply | Qty: 75 | Fill #5

## 2018-12-23 DIAGNOSIS — E349 Endocrine disorder, unspecified: Secondary | ICD-10-CM | POA: Diagnosis not present

## 2018-12-23 DIAGNOSIS — Z79899 Other long term (current) drug therapy: Secondary | ICD-10-CM | POA: Diagnosis not present

## 2018-12-23 DIAGNOSIS — E7800 Pure hypercholesterolemia, unspecified: Secondary | ICD-10-CM | POA: Diagnosis not present

## 2018-12-28 DIAGNOSIS — A6000 Herpesviral infection of urogenital system, unspecified: Secondary | ICD-10-CM | POA: Diagnosis not present

## 2018-12-28 DIAGNOSIS — E349 Endocrine disorder, unspecified: Secondary | ICD-10-CM | POA: Diagnosis not present

## 2018-12-28 DIAGNOSIS — E669 Obesity, unspecified: Secondary | ICD-10-CM | POA: Diagnosis not present

## 2018-12-28 DIAGNOSIS — Z6836 Body mass index (BMI) 36.0-36.9, adult: Secondary | ICD-10-CM | POA: Diagnosis not present

## 2018-12-28 DIAGNOSIS — R05 Cough: Secondary | ICD-10-CM | POA: Diagnosis not present

## 2018-12-28 DIAGNOSIS — E7800 Pure hypercholesterolemia, unspecified: Secondary | ICD-10-CM | POA: Diagnosis not present

## 2018-12-28 DIAGNOSIS — Z9181 History of falling: Secondary | ICD-10-CM | POA: Diagnosis not present

## 2018-12-28 DIAGNOSIS — Z1331 Encounter for screening for depression: Secondary | ICD-10-CM | POA: Diagnosis not present

## 2018-12-28 MED FILL — BENZONATATE 200 MG CAPS: 200 | 10 days supply | Qty: 30 | Fill #0

## 2018-12-28 MED FILL — TESTOSTERONE 20.25 MG/ACT (: 20.25 MG/AC | 30 days supply | Qty: 75 | Fill #0

## 2019-01-10 MED FILL — SIMVASTATIN 40 MG TABLET: 40 | 90 days supply | Qty: 90 | Fill #1

## 2019-01-23 MED FILL — TESTOSTERONE 20.25 MG/ACT (: 20.25 MG/AC | 30 days supply | Qty: 75 | Fill #1 | Status: TO

## 2019-02-21 MED FILL — TESTOSTERONE 20.25 MG/ACT (: 20.25 MG/AC | 30 days supply | Qty: 75 | Fill #0

## 2019-02-21 MED FILL — FAMCICLOVIR 250 MG TABLET: 250 | 30 days supply | Qty: 30 | Fill #0 | Status: TO

## 2019-02-28 DIAGNOSIS — H25813 Combined forms of age-related cataract, bilateral: Secondary | ICD-10-CM | POA: Diagnosis not present

## 2019-02-28 DIAGNOSIS — H52223 Regular astigmatism, bilateral: Secondary | ICD-10-CM | POA: Diagnosis not present

## 2019-02-28 DIAGNOSIS — H5203 Hypermetropia, bilateral: Secondary | ICD-10-CM | POA: Diagnosis not present

## 2019-02-28 DIAGNOSIS — H43393 Other vitreous opacities, bilateral: Secondary | ICD-10-CM | POA: Diagnosis not present

## 2019-02-28 DIAGNOSIS — H524 Presbyopia: Secondary | ICD-10-CM | POA: Diagnosis not present

## 2019-03-24 ENCOUNTER — Telehealth: Payer: Self-pay

## 2019-03-24 MED FILL — TESTOSTERONE 20.25 MG/ACT (: 20.25 MG/AC | 30 days supply | Qty: 75 | Fill #1

## 2019-03-24 NOTE — Telephone Encounter (Signed)
Submitted VOB for Monovisc, left knee. 

## 2019-03-29 MED FILL — FAMCICLOVIR 250 MG TABLET: 250 | 30 days supply | Qty: 30 | Fill #0

## 2019-04-10 MED FILL — SIMVASTATIN 40 MG TABLET: 40 | 90 days supply | Qty: 90 | Fill #0

## 2019-04-19 ENCOUNTER — Ambulatory Visit: Payer: Self-pay | Admitting: Orthopedic Surgery

## 2019-04-21 ENCOUNTER — Ambulatory Visit (INDEPENDENT_AMBULATORY_CARE_PROVIDER_SITE_OTHER): Payer: 59 | Admitting: Orthopedic Surgery

## 2019-04-21 ENCOUNTER — Other Ambulatory Visit: Payer: Self-pay

## 2019-04-21 ENCOUNTER — Encounter: Payer: Self-pay | Admitting: Orthopedic Surgery

## 2019-04-21 DIAGNOSIS — M1712 Unilateral primary osteoarthritis, left knee: Secondary | ICD-10-CM | POA: Diagnosis not present

## 2019-04-21 MED ORDER — LIDOCAINE HCL 1 % IJ SOLN
5.0000 mL | INTRAMUSCULAR | Status: AC | PRN
  Administered 2019-04-21: 5 mL

## 2019-04-21 MED ORDER — HYALURONAN 88 MG/4ML IX SOSY
88.0000 mg | PREFILLED_SYRINGE | INTRA_ARTICULAR | Status: AC | PRN
  Administered 2019-04-21: 88 mg via INTRA_ARTICULAR

## 2019-04-21 MED FILL — TESTOSTERONE 20.25 MG/ACT (: 20.25 MG/AC | 30 days supply | Qty: 75 | Fill #0

## 2019-04-21 NOTE — Progress Notes (Signed)
   Procedure Note  Patient: [name]             Date of Birth: 1951-04-14           MRN: [id number]             Visit Date: 04/21/2019  Procedures: Visit Diagnoses: Unilateral primary osteoarthritis, left knee  Large Joint Inj: L knee on 04/21/2019 4:56 PM Indications: pain, joint swelling and diagnostic evaluation Details: 18 G 1.5 in needle, superolateral approach  Arthrogram: No  Medications: 5 mL lidocaine 1 %; 88 mg Hyaluronan 88 MG/4ML Outcome: tolerated well, no immediate complications Procedure, treatment alternatives, risks and benefits explained, specific risks discussed. Consent was given by the patient. Immediately prior to procedure a time out was called to verify the correct patient, procedure, equipment, support staff and site/side marked as required. Patient was prepped and draped in the usual sterile fashion.     This patient is diagnosed with osteoarthritis of the knee(s).    Radiographs show evidence of joint space narrowing, osteophytes, subchondral sclerosis and/or subchondral cysts.  This patient has knee pain which interferes with functional and activities of daily living.    This patient has experienced inadequate response, adverse effects and/or intolerance with conservative treatments such as acetaminophen, NSAIDS, topical creams, physical therapy or regular exercise, knee bracing and/or weight loss.   This patient has experienced inadequate response or has a contraindication to intra articular steroid injections for at least 3 months.   This patient is not scheduled to have a total knee replacement within 6 months of starting treatment with viscosupplementation.

## 2019-04-28 MED FILL — FAMCICLOVIR 250 MG TABLET: 250 | 30 days supply | Qty: 30 | Fill #1

## 2019-05-18 MED FILL — TESTOSTERONE 20.25 MG/ACT (: 20.25 MG/AC | 30 days supply | Qty: 75 | Fill #1

## 2019-05-26 MED FILL — FAMCICLOVIR 250 MG TABLET: 250 | 90 days supply | Qty: 90 | Fill #0

## 2019-06-14 ENCOUNTER — Ambulatory Visit (INDEPENDENT_AMBULATORY_CARE_PROVIDER_SITE_OTHER): Payer: 59 | Admitting: Orthopedic Surgery

## 2019-06-14 ENCOUNTER — Encounter: Payer: Self-pay | Admitting: Orthopedic Surgery

## 2019-06-14 ENCOUNTER — Other Ambulatory Visit: Payer: Self-pay

## 2019-06-14 DIAGNOSIS — M1711 Unilateral primary osteoarthritis, right knee: Secondary | ICD-10-CM

## 2019-06-16 ENCOUNTER — Encounter: Payer: Self-pay | Admitting: Orthopedic Surgery

## 2019-06-16 DIAGNOSIS — M1711 Unilateral primary osteoarthritis, right knee: Secondary | ICD-10-CM

## 2019-06-16 MED ORDER — METHYLPREDNISOLONE ACETATE 40 MG/ML IJ SUSP
40.0000 mg | INTRAMUSCULAR | Status: AC | PRN
  Administered 2019-06-16: 40 mg via INTRA_ARTICULAR

## 2019-06-16 MED ORDER — LIDOCAINE HCL 1 % IJ SOLN
5.0000 mL | INTRAMUSCULAR | Status: AC | PRN
  Administered 2019-06-16: 5 mL

## 2019-06-16 MED ORDER — BUPIVACAINE HCL 0.25 % IJ SOLN
4.0000 mL | INTRAMUSCULAR | Status: AC | PRN
  Administered 2019-06-16: 4 mL via INTRA_ARTICULAR

## 2019-06-16 MED FILL — TESTOSTERONE 20.25 MG/ACT (: 20.25 MG/AC | 30 days supply | Qty: 75 | Fill #0

## 2019-06-16 NOTE — Progress Notes (Signed)
Office Visit Note   Patient: [name]           Date of Birth: 1951-03-01           MRN: [id number] Visit Date: 06/14/2019 Requested by: Practice, [place],  [place] [id number] PCP: [facility]  Subjective: Chief Complaint  Patient presents with  . 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  He has been doing reasonably well with the left knee since the prior injection but he describes about 3 days of pretty significant right knee pain.  Does not have a history of gout.  Describes stabbing type pain in that right knee with no history of trauma.  Is been taking over-the-counter medication with not much relief.  He has actually had to take off work for the past 3 nights due to his knee pain.              ROS: All systems reviewed are negative as they relate to the chief complaint within the history of present illness.  Patient denies  fevers or chills.   Assessment & Plan: Visit Diagnoses: No diagnosis found.  Plan: Impression is right knee pain and arthritis with joint space narrowing on radiographs obtained previously looking at the left knee.  His collateral crucial ligaments are stable and there is trace effusion present.  I think this is exacerbation of arthritis in the right knee.  Plan is injection today with preapproval of gel injection.  Continue with non-loadbearing quad strengthening exercises.  Follow-up as needed.  He is going to get her knees replaced at sometime in the future but he wants to try to hold off as long as he can because he does have a stepdaughter in college who he is helping to support.  Follow-Up Instructions: Return if symptoms worsen or fail to improve.   Orders:  No orders of the defined types were placed in this encounter.  No orders of the defined types were placed in this encounter.     Procedures: Large Joint Inj: R knee on 06/16/2019 3:04  PM Indications: diagnostic evaluation, joint swelling and pain Details: 18 G 1.5 in needle, superolateral approach  Arthrogram: No  Medications: 5 mL lidocaine 1 %; 40 mg methylPREDNISolone acetate 40 MG/ML; 4 mL bupivacaine 0.25 % Outcome: tolerated well, no immediate complications Procedure, treatment alternatives, risks and benefits explained, specific risks discussed. Consent was given by the patient. Immediately prior to procedure a time out was called to verify the correct patient, procedure, equipment, support staff and site/side marked as required. Patient was prepped and draped in the usual sterile fashion.       Clinical Data: No additional findings.  Objective: Vital Signs: There were no vitals taken for this visit.  Physical Exam:   Constitutional: Patient appears well-developed HEENT:  Head: Normocephalic Eyes:EOM are normal Neck: Normal range of motion Cardiovascular: Normal rate Pulmonary/chest: Effort normal Neurologic: Patient is alert Skin: Skin is warm Psychiatric: Patient has normal mood and affect    Ortho Exam: Ortho exam demonstrates full range of motion of the hips and ankles.  Left knee has no effusion.  Right knee trace effusion.  Does have a medial and lateral joint line tenderness but the medial is a little bit more.  Extensor mechanism is intact.  Collateral crucial ligaments are stable.  Does have some pain with flexion past 90.  No focal tibial  plateau tenderness.  Specialty Comments:  No specialty comments available.  Imaging: No results found.   PMFS History: Patient Active Problem List   Diagnosis Date Noted  . Infected cyst of skin 08/19/2012    Class: Chronic   Past Medical History:  Diagnosis Date  . GERD (gastroesophageal reflux disease)   . History of adenomatous polyp of colon    2004  hyperplastic and tubular adenoma/  2012 hyperplastic  . History of aspiration pneumonitis    03/ 2015  . HSV-1 (herpes simplex virus 1)  infection    chronic fever blister's  . Hyperlipidemia   . Phimosis   . Snores    wears snore night gaurd /  per pt no sleep study  . Wears glasses     History reviewed. No pertinent family history.  Past Surgical History:  Procedure Laterality Date  . ARTHROSCOPIC REPAIR ACL Left 1992  . CIRCUMCISION N/A 08/06/2016   Procedure: CIRCUMCISION ADULT;  Surgeon: Ardis Hughs, MD;  Location: [facility];  Service: Urology;  Laterality: N/A;  . COLONOSCOPY  last one 05-29-2011  . EAR CYST EXCISION  08/19/2012   Procedure: CYST REMOVAL;  Surgeon: Jerrell Belfast, MD;  Location: [facility];  Service: ENT;  Laterality: Left;  excision of left  infra auricular cyst   . INGUINAL HERNIA REPAIR Right 1980's  . KNEE ARTHROSCOPY Left 04/22/2006   Social History   Occupational History  . Not on file  Tobacco Use  . Smoking status: Never Smoker  . Smokeless tobacco: Never Used  Substance and Sexual Activity  . Alcohol use: No  . Drug use: No  . Sexual activity: Yes

## 2019-06-19 ENCOUNTER — Ambulatory Visit: Payer: 59 | Admitting: Orthopedic Surgery

## 2019-06-20 ENCOUNTER — Telehealth: Payer: Self-pay

## 2019-06-20 ENCOUNTER — Telehealth: Payer: Self-pay | Admitting: Radiology

## 2019-06-20 NOTE — Telephone Encounter (Signed)
Per Dr. Marlou Sa, submit for Monovisc injection right knee for right knee OA.  Submitted by April J.  Awaiting approval.

## 2019-06-20 NOTE — Telephone Encounter (Signed)
Submitted VOB for Monovisc, right knee. 

## 2019-06-21 ENCOUNTER — Ambulatory Visit (INDEPENDENT_AMBULATORY_CARE_PROVIDER_SITE_OTHER): Payer: 59 | Admitting: Orthopedic Surgery

## 2019-06-21 ENCOUNTER — Ambulatory Visit (INDEPENDENT_AMBULATORY_CARE_PROVIDER_SITE_OTHER): Payer: 59

## 2019-06-21 ENCOUNTER — Encounter: Payer: Self-pay | Admitting: Orthopedic Surgery

## 2019-06-21 DIAGNOSIS — G8929 Other chronic pain: Secondary | ICD-10-CM

## 2019-06-21 DIAGNOSIS — M1711 Unilateral primary osteoarthritis, right knee: Secondary | ICD-10-CM | POA: Diagnosis not present

## 2019-06-21 DIAGNOSIS — M25561 Pain in right knee: Secondary | ICD-10-CM

## 2019-06-23 ENCOUNTER — Encounter: Payer: Self-pay | Admitting: Orthopedic Surgery

## 2019-06-23 DIAGNOSIS — M1711 Unilateral primary osteoarthritis, right knee: Secondary | ICD-10-CM

## 2019-06-23 DIAGNOSIS — M25561 Pain in right knee: Secondary | ICD-10-CM | POA: Diagnosis not present

## 2019-06-23 DIAGNOSIS — G8929 Other chronic pain: Secondary | ICD-10-CM | POA: Diagnosis not present

## 2019-06-23 MED ORDER — BUPIVACAINE HCL 0.25 % IJ SOLN
4.0000 mL | INTRAMUSCULAR | Status: AC | PRN
  Administered 2019-06-23: 4 mL via INTRA_ARTICULAR

## 2019-06-23 MED ORDER — LIDOCAINE HCL 1 % IJ SOLN
5.0000 mL | INTRAMUSCULAR | Status: AC | PRN
  Administered 2019-06-23: 5 mL

## 2019-06-23 NOTE — Progress Notes (Signed)
Office Visit Note   Patient: [name]           Date of Birth: 06-26-1951           MRN: [id number] Visit Date: 06/21/2019 Requested by: Practice, [place],  [place] [id number] PCP: [facility]  Subjective: Chief Complaint  Patient presents with   Right Knee - Pain    HPI: [name] is a patient with right knee pain.  He has known history of bilateral knee arthritis.  He is doing well with gel injections on that left knee.  Right knee became fairly acutely symptomatic for him over the past 3 to 4 days to the point where he has had to take off work.  He is going to the beach this week.  He is having fairly debilitating pain primarily at night.  He is able to get through his daily work routine but the night pain is becoming somewhat unmanageable.  Denies any history of gout.  He has been taking over-the-counter medication with some relief.              ROS: All systems reviewed are negative as they relate to the chief complaint within the history of present illness.  Patient denies  fevers or chills.   Assessment & Plan: Visit Diagnoses:  1. Chronic pain of right knee   2. Arthritis of right knee     Plan: Impression is right knee pain with likely acute exacerbation of existing arthritis.  No loose bodies seen on the plain radiographs.  He did have a cortisone injection about 2 weeks ago.  The only thing we could really do at this time would be preapproved him for Monovisc as well as try a Toradol injection into the knee today.  We will see him back hopefully within the next week or 2 to try the Monovisc injection in the right knee as it has helped his left knee significantly. This patient is diagnosed with osteoarthritis of the knee(s).    Radiographs show evidence of joint space narrowing, osteophytes, subchondral sclerosis and/or subchondral cysts.  This patient has knee pain which interferes with functional and  activities of daily living.    This patient has experienced inadequate response, adverse effects and/or intolerance with conservative treatments such as acetaminophen, NSAIDS, topical creams, physical therapy or regular exercise, knee bracing and/or weight loss.   This patient has experienced inadequate response or has a contraindication to intra articular steroid injections for at least 3 months.   This patient is not scheduled to have a total knee replacement within 6 months of starting treatment with viscosupplementation.   Follow-Up Instructions: Return if symptoms worsen or fail to improve.   Orders:  Orders Placed This Encounter  Procedures   XR KNEE 3 VIEW RIGHT   No orders of the defined types were placed in this encounter.     Procedures: Large Joint Inj: R knee on 06/23/2019 8:46 PM Indications: diagnostic evaluation, joint swelling and pain Details: 18 G 1.5 in needle, superolateral approach  Arthrogram: No  Medications: 5 mL lidocaine 1 %; 4 mL bupivacaine 0.25 % Outcome: tolerated well, no immediate complications Procedure, treatment alternatives, risks and benefits explained, specific risks discussed. Consent was given by the patient. Immediately prior to procedure a time out was called to verify the correct patient, procedure, equipment, support staff and site/side marked as required. Patient was prepped and draped in the usual sterile fashion.  30 mg Toradol injected with the bupivacaine.   Clinical Data: No additional findings.  Objective: Vital Signs: There were no vitals taken for this visit.  Physical Exam:   Constitutional: Patient appears well-developed HEENT:  Head: Normocephalic Eyes:EOM are normal Neck: Normal range of motion Cardiovascular: Normal rate Pulmonary/chest: Effort normal Neurologic: Patient is alert Skin: Skin is warm Psychiatric: Patient has normal mood and affect    Ortho Exam: Ortho exam demonstrates full active and  passive range of motion of the hips and ankle.  He does have about a 5 reflex contracture on the right but bends past 90.  Trace effusion is present.  Extensor mechanism is intact.  No significant coarse grinding or crepitus noted in that right knee region that is asymmetric to the left-hand side.  Specialty Comments:  No specialty comments available.  Imaging: No results found.   PMFS History: Patient Active Problem List   Diagnosis Date Noted   Infected cyst of skin 08/19/2012    Class: Chronic   Past Medical History:  Diagnosis Date   GERD (gastroesophageal reflux disease)    History of adenomatous polyp of colon    2004  hyperplastic and tubular adenoma/  2012 hyperplastic   History of aspiration pneumonitis    03/ 2015   HSV-1 (herpes simplex virus 1) infection    chronic fever blister's   Hyperlipidemia    Phimosis    Snores    wears snore night gaurd /  per pt no sleep study   Wears glasses     History reviewed. No pertinent family history.  Past Surgical History:  Procedure Laterality Date   ARTHROSCOPIC REPAIR ACL Left 1992   CIRCUMCISION N/A 08/06/2016   Procedure: CIRCUMCISION ADULT;  Surgeon: Ardis Hughs, MD;  Location: [facility];  Service: Urology;  Laterality: N/A;   COLONOSCOPY  last one 05-29-2011   EAR CYST EXCISION  08/19/2012   Procedure: CYST REMOVAL;  Surgeon: Jerrell Belfast, MD;  Location: [facility];  Service: ENT;  Laterality: Left;  excision of left  infra auricular cyst    INGUINAL HERNIA REPAIR Right 1980's   KNEE ARTHROSCOPY Left 04/22/2006   Social History   Occupational History   Not on file  Tobacco Use   Smoking status: Never Smoker   Smokeless tobacco: Never Used  Substance and Sexual Activity   Alcohol use: No   Drug use: No   Sexual activity: Yes

## 2019-06-26 ENCOUNTER — Telehealth: Payer: Self-pay

## 2019-06-26 NOTE — Telephone Encounter (Signed)
Patient aware that he is approved for gel injection.  Approved for Monovisc, right knee. Willacy has been met, patient will be responsible for co-pay. Co-pay of $150.00 required No PA required  Appt. 06/30/2019 with Dr. Marlou Sa

## 2019-06-26 NOTE — Progress Notes (Signed)
Talked with patient concerning approval for right knee gel injection.  Appt. Friday, 06/30/2019

## 2019-06-28 DIAGNOSIS — Z79899 Other long term (current) drug therapy: Secondary | ICD-10-CM | POA: Diagnosis not present

## 2019-06-28 DIAGNOSIS — E349 Endocrine disorder, unspecified: Secondary | ICD-10-CM | POA: Diagnosis not present

## 2019-06-28 DIAGNOSIS — E7800 Pure hypercholesterolemia, unspecified: Secondary | ICD-10-CM | POA: Diagnosis not present

## 2019-06-28 DIAGNOSIS — Z125 Encounter for screening for malignant neoplasm of prostate: Secondary | ICD-10-CM | POA: Diagnosis not present

## 2019-06-30 ENCOUNTER — Ambulatory Visit (INDEPENDENT_AMBULATORY_CARE_PROVIDER_SITE_OTHER): Payer: 59 | Admitting: Orthopedic Surgery

## 2019-06-30 ENCOUNTER — Ambulatory Visit (INDEPENDENT_AMBULATORY_CARE_PROVIDER_SITE_OTHER): Payer: 59

## 2019-06-30 DIAGNOSIS — M79672 Pain in left foot: Secondary | ICD-10-CM | POA: Diagnosis not present

## 2019-06-30 MED ORDER — DICLOFENAC EPOLAMINE 1.3 % TD PTCH: MEDICATED_PATCH | TRANSDERMAL | 0 refills | Status: DC

## 2019-06-30 MED FILL — DICLOFENAC EPOLAMINE 1.3 %: 1.3 | 15 days supply | Qty: 30 | Fill #0

## 2019-07-01 ENCOUNTER — Encounter: Payer: Self-pay | Admitting: Orthopedic Surgery

## 2019-07-01 DIAGNOSIS — M79672 Pain in left foot: Secondary | ICD-10-CM | POA: Diagnosis not present

## 2019-07-01 MED ORDER — LIDOCAINE HCL 1 % IJ SOLN
5.0000 mL | INTRAMUSCULAR | Status: AC | PRN
  Administered 2019-07-01: 5 mL

## 2019-07-01 MED ORDER — HYALURONAN 88 MG/4ML IX SOSY
88.0000 mg | PREFILLED_SYRINGE | INTRA_ARTICULAR | Status: AC | PRN
  Administered 2019-07-01: 88 mg via INTRA_ARTICULAR

## 2019-07-01 NOTE — Progress Notes (Signed)
Office Visit Note   Patient: [name]           Date of Birth: [birth year]           MRN: [id number] Visit Date: 06/30/2019 Requested by: Practice, [place],  [place] [id number] PCP: [facility]  Subjective: Chief Complaint  Patient presents with  . Right Knee - Pain  . Left Foot - Pain    HPI: Corell is a patient with right knee pain.  He just got back from the beach.  Had a Toradol injection before he left and that helped him for about 2 days.  He is having predominantly left heel pain now more so than his knees.  Left knee is doing reasonably well.  He does report pain with ambulation and difficulty with start up pain after he has been sitting for a period of 20 minutes.  The pain he localizes predominantly to the posterior aspect of the heel with some medial sided pain as well.              ROS: All systems reviewed are negative as they relate to the chief complaint within the history of present illness.  Patient denies  fevers or chills.   Assessment & Plan: Visit Diagnoses:  1. Pain of left heel     Plan: Impression is right knee arthritis with marginal response to both cortisone and Toradol.  He has been helped by those but only for several days.  Plan is right knee Monovisc injection today.  Regarding the left heel radiographs and exam are not really consistent with stress fracture.  I think most of this is insertional Achilles tendinitis with Haglund's deformity.  I will have him continue stretching although he does have pretty good flexibility.  Flector patch to left heel every 12 hours.  I think that will help with some of the pain that he is having.  Follow-up with me in 6 months for repeat Monovisc injections into both knees.This patient is diagnosed with osteoarthritis of the knee(s).    Radiographs show evidence of joint space narrowing, osteophytes, subchondral sclerosis and/or subchondral cysts.  This  patient has knee pain which interferes with functional and activities of daily living.    This patient has experienced inadequate response, adverse effects and/or intolerance with conservative treatments such as acetaminophen, NSAIDS, topical creams, physical therapy or regular exercise, knee bracing and/or weight loss.   This patient has experienced inadequate response or has a contraindication to intra articular steroid injections for at least 3 months.   This patient is not scheduled to have a total knee replacement within 6 months of starting treatment with viscosupplementation.   Follow-Up Instructions: Return if symptoms worsen or fail to improve.   Orders:  Orders Placed This Encounter  Procedures  . XR Os Calcis Left   Meds ordered this encounter  Medications  . diclofenac (FLECTOR) 1.3 % PTCH    Sig: Apply patch to left heel every 12hrs prn    Dispense:  30 patch    Refill:  0      Procedures: Large Joint Inj: R knee on 07/01/2019 9:30 AM Indications: pain, joint swelling and diagnostic evaluation Details: 18 G 1.5 in needle, superolateral approach  Arthrogram: No  Medications: 5 mL lidocaine 1 %; 88 mg Hyaluronan 88 MG/4ML Outcome: tolerated well, no immediate complications Procedure, treatment alternatives, risks and benefits explained, specific risks discussed. Consent was given by the patient. Immediately  prior to procedure a time out was called to verify the correct patient, procedure, equipment, support staff and site/side marked as required. Patient was prepped and draped in the usual sterile fashion.       Clinical Data: No additional findings.  Objective: Vital Signs: There were no vitals taken for this visit.  Physical Exam:   Constitutional: Patient appears well-developed HEENT:  Head: Normocephalic Eyes:EOM are normal Neck: Normal range of motion Cardiovascular: Normal rate Pulmonary/chest: Effort normal Neurologic: Patient is alert Skin:  Skin is warm Psychiatric: Patient has normal mood and affect    Ortho Exam: Ortho exam demonstrates trace effusion right knee with good range of motion extensor mechanism is intact no effusion in the left knee.  Collateral crucial ligaments are stable slight varus alignment present.  Pedal pulses palpable.  On that left heel squeeze test is negative.  Does have prominence at the Achilles tendon insertion site with tenderness in this area.  Has ankle dorsiflexion about 20 degrees past neutral symmetric to the right-hand side.  Specialty Comments:  No specialty comments available.  Imaging: No results found.   PMFS History: Patient Active Problem List   Diagnosis Date Noted  . Infected cyst of skin 08/19/2012    Class: Chronic   Past Medical History:  Diagnosis Date  . GERD (gastroesophageal reflux disease)   . History of adenomatous polyp of colon    2004  hyperplastic and tubular adenoma/  2012 hyperplastic  . History of aspiration pneumonitis    03/ 2015  . HSV-1 (herpes simplex virus 1) infection    chronic fever blister's  . Hyperlipidemia   . Phimosis   . Snores    wears snore night gaurd /  per pt no sleep study  . Wears glasses     History reviewed. No pertinent family history.  Past Surgical History:  Procedure Laterality Date  . ARTHROSCOPIC REPAIR ACL Left 1992  . CIRCUMCISION N/A 08/06/2016   Procedure: CIRCUMCISION ADULT;  Surgeon: Ardis Hughs, MD;  [facility];  Service: Urology;  Laterality: N/A;  . COLONOSCOPY  last one 05-29-2011  . EAR CYST EXCISION  08/19/2012   Procedure: CYST REMOVAL;  Surgeon: Jerrell Belfast, MD;  Location: [facility];  Service: ENT;  Laterality: Left;  excision of left  infra auricular cyst   . INGUINAL HERNIA REPAIR Right 1980's  . KNEE ARTHROSCOPY Left 04/22/2006   Social History   Occupational History  . Not on file  Tobacco Use  . Smoking status: Never Smoker  . Smokeless  tobacco: Never Used  Substance and Sexual Activity  . Alcohol use: No  . Drug use: No  . Sexual activity: Yes

## 2019-07-04 NOTE — Progress Notes (Signed)
Noted.  Will submit for Monovisc, bilateral knee in February, 2021.

## 2019-07-05 DIAGNOSIS — Z139 Encounter for screening, unspecified: Secondary | ICD-10-CM | POA: Diagnosis not present

## 2019-07-05 DIAGNOSIS — E349 Endocrine disorder, unspecified: Secondary | ICD-10-CM | POA: Diagnosis not present

## 2019-07-05 DIAGNOSIS — E7800 Pure hypercholesterolemia, unspecified: Secondary | ICD-10-CM | POA: Diagnosis not present

## 2019-07-05 DIAGNOSIS — A6000 Herpesviral infection of urogenital system, unspecified: Secondary | ICD-10-CM | POA: Diagnosis not present

## 2019-07-26 MED FILL — TESTOSTERONE 20.25 MG/ACT (: 20.25 MG/AC | 30 days supply | Qty: 75 | Fill #0

## 2019-08-04 DIAGNOSIS — M67872 Other specified disorders of synovium, left ankle and foot: Secondary | ICD-10-CM | POA: Diagnosis not present

## 2019-08-04 DIAGNOSIS — M898X7 Other specified disorders of bone, ankle and foot: Secondary | ICD-10-CM | POA: Diagnosis not present

## 2019-08-14 ENCOUNTER — Other Ambulatory Visit: Payer: Self-pay

## 2019-08-14 ENCOUNTER — Encounter: Payer: Self-pay | Admitting: Orthopedic Surgery

## 2019-08-14 ENCOUNTER — Ambulatory Visit (INDEPENDENT_AMBULATORY_CARE_PROVIDER_SITE_OTHER): Payer: 59 | Admitting: Orthopedic Surgery

## 2019-08-14 VITALS — Ht 69.0 in | Wt 240.0 lb

## 2019-08-14 DIAGNOSIS — M1711 Unilateral primary osteoarthritis, right knee: Secondary | ICD-10-CM | POA: Diagnosis not present

## 2019-08-18 DIAGNOSIS — M67879 Other specified disorders of synovium and tendon, unspecified ankle and foot: Secondary | ICD-10-CM | POA: Diagnosis not present

## 2019-08-19 ENCOUNTER — Encounter: Payer: Self-pay | Admitting: Orthopedic Surgery

## 2019-08-19 DIAGNOSIS — M1711 Unilateral primary osteoarthritis, right knee: Secondary | ICD-10-CM

## 2019-08-19 MED ORDER — METHYLPREDNISOLONE ACETATE 40 MG/ML IJ SUSP
40.0000 mg | INTRAMUSCULAR | Status: AC | PRN
  Administered 2019-08-19: 40 mg via INTRA_ARTICULAR

## 2019-08-19 MED ORDER — LIDOCAINE HCL 1 % IJ SOLN
5.0000 mL | INTRAMUSCULAR | Status: AC | PRN
  Administered 2019-08-19: 5 mL

## 2019-08-19 MED ORDER — BUPIVACAINE HCL 0.25 % IJ SOLN
4.0000 mL | INTRAMUSCULAR | Status: AC | PRN
  Administered 2019-08-19: 4 mL via INTRA_ARTICULAR

## 2019-08-19 NOTE — Progress Notes (Signed)
Office Visit Note   Patient: [name]           Date of Birth: November 13, 1950           MRN: [id number] Visit Date: 08/14/2019 Requested by: Practice, [place],  [place] [address] PCP: [facility]  Subjective: Chief Complaint  Patient presents with  . Right Knee - Pain    HPI: [name] is a patient with known right knee arthritis.  He has been doing reasonably well with the cortisone and gel injections.  He is having some left Achilles tendon issues.  He is getting treatment with that which sounds like it is some type of ultrasound PRP type of injection/treatment.  The right knee has been bothering him.  Denies any recent injury.              ROS: All systems reviewed are negative as they relate to the chief complaint within the history of present illness.  Patient denies  fevers or chills.   Assessment & Plan: Visit Diagnoses:  1. Arthritis of right knee     Plan: Impression is right knee arthritis.  Aspiration injection performed today in the right knee with cortisone.  We will plan on gel injection 6 months from his previous gel injection in the right knee if needed.This patient is diagnosed with osteoarthritis of the knee(s).    Radiographs show evidence of joint space narrowing, osteophytes, subchondral sclerosis and/or subchondral cysts.  This patient has knee pain which interferes with functional and activities of daily living.    This patient has experienced inadequate response, adverse effects and/or intolerance with conservative treatments such as acetaminophen, NSAIDS, topical creams, physical therapy or regular exercise, knee bracing and/or weight loss.   This patient has experienced inadequate response or has a contraindication to intra articular steroid injections for at least 3 months.   This patient is not scheduled to have a total knee replacement within 6 months of starting treatment with  viscosupplementation.   Follow-Up Instructions: Return if symptoms worsen or fail to improve.   Orders:  No orders of the defined types were placed in this encounter.  No orders of the defined types were placed in this encounter.     Procedures: Large Joint Inj: R knee on 08/19/2019 6:56 PM Indications: diagnostic evaluation, joint swelling and pain Details: 18 G 1.5 in needle, superolateral approach  Arthrogram: No  Medications: 5 mL lidocaine 1 %; 40 mg methylPREDNISolone acetate 40 MG/ML; 4 mL bupivacaine 0.25 % Outcome: tolerated well, no immediate complications Procedure, treatment alternatives, risks and benefits explained, specific risks discussed. Consent was given by the patient. Immediately prior to procedure a time out was called to verify the correct patient, procedure, equipment, support staff and site/side marked as required. Patient was prepped and draped in the usual sterile fashion.       Clinical Data: No additional findings.  Objective: Vital Signs: Ht 5\' 9"  (1.753 m)   Wt 240 lb (108.9 kg)   BMI 35.44 kg/m   Physical Exam:   Constitutional: Patient appears well-developed HEENT:  Head: Normocephalic Eyes:EOM are normal Neck: Normal range of motion Cardiovascular: Normal rate Pulmonary/chest: Effort normal Neurologic: Patient is alert Skin: Skin is warm Psychiatric: Patient has normal mood and affect    Ortho Exam: Ortho exam demonstrates full active and passive range of motion of the ankle and hip on the right-hand side.  Knee has about a 5 degree flexion contracture  but does bend easily past 90.  Extensor mechanism is intact.  Has medial greater than lateral joint line tenderness and mild patellofemoral crepitus.  Trace effusion is present.  Specialty Comments:  No specialty comments available.  Imaging: No results found.   PMFS History: Patient Active Problem List   Diagnosis Date Noted  . Infected cyst of skin 08/19/2012    Class:  Chronic   Past Medical History:  Diagnosis Date  . GERD (gastroesophageal reflux disease)   . History of adenomatous polyp of colon    2004  hyperplastic and tubular adenoma/  2012 hyperplastic  . History of aspiration pneumonitis    03/ 2015  . HSV-1 (herpes simplex virus 1) infection    chronic fever blister's  . Hyperlipidemia   . Phimosis   . Snores    wears snore night gaurd /  per pt no sleep study  . Wears glasses     History reviewed. No pertinent family history.  Past Surgical History:  Procedure Laterality Date  . ARTHROSCOPIC REPAIR ACL Left 1992  . CIRCUMCISION N/A 08/06/2016   Procedure: CIRCUMCISION ADULT;  Surgeon: Ardis Hughs, MD;  Location: [facility];  Service: Urology;  Laterality: N/A;  . COLONOSCOPY  last one 05-29-2011  . EAR CYST EXCISION  08/19/2012   Procedure: CYST REMOVAL;  Surgeon: Jerrell Belfast, MD;  Location: [facility];  Service: ENT;  Laterality: Left;  excision of left  infra auricular cyst   . INGUINAL HERNIA REPAIR Right 1980's  . KNEE ARTHROSCOPY Left 04/22/2006   Social History   Occupational History  . Not on file  Tobacco Use  . Smoking status: Never Smoker  . Smokeless tobacco: Never Used  Substance and Sexual Activity  . Alcohol use: No  . Drug use: No  . Sexual activity: Yes

## 2019-08-23 MED FILL — TESTOSTERONE 20.25 MG/ACT (: 20.25 MG/AC | 30 days supply | Qty: 75 | Fill #1

## 2019-08-23 MED FILL — FAMCICLOVIR 250 MG TABLET: 250 | 45 days supply | Qty: 90 | Fill #0

## 2019-09-11 ENCOUNTER — Telehealth: Payer: Self-pay | Admitting: Radiology

## 2019-09-11 NOTE — Telephone Encounter (Signed)
Left knee, Monovisc, last done 04-21-19.  Dean patient.

## 2019-09-14 NOTE — Telephone Encounter (Signed)
Submitted online [employer] for Monovisc left knee, needs to be scheduled after 10/21/19.  VOB pending.

## 2019-09-18 NOTE — Telephone Encounter (Signed)
Per VOB, PA required, buy and bill ok, covered at 100% after a $50 copay.   Will call [facility] to initiate PA.

## 2019-09-19 NOTE — Telephone Encounter (Signed)
PA started, faxed clinicals to [facility] at [phone], will follow up.

## 2019-09-21 ENCOUNTER — Telehealth: Payer: Self-pay | Admitting: Orthopedic Surgery

## 2019-09-21 MED FILL — TESTOSTERONE 20.25 MG/ACT (: 20.25 MG/AC | 30 days supply | Qty: 75 | Fill #2

## 2019-09-21 MED FILL — SIMVASTATIN 40 MG TABLET: 40 | 90 days supply | Qty: 90 | Fill #1

## 2019-09-21 NOTE — Telephone Encounter (Signed)
PA turnaround is 5-15 days, call first of week to follow up.   Pending PA #/Case# [id number]. Ph # [phone].

## 2019-09-21 NOTE — Telephone Encounter (Signed)
Will you please call and schedule an appt for left knee Durolane injections with Dr Marlou Sa?  Appt has to be after 10/21/19.  But before first of year.  He will have a $50 copay. Thanks.  [facility] rep called, and Monovisc is not a preferred drug for them.  Durolane is, and it does not require PA.  Patient will have a $50 copay, buy and bill ok.

## 2019-09-21 NOTE — Telephone Encounter (Signed)
Talked with patient to schedule Durolane advised $50 copay buy & bill. Scheduled 10/25/19. Patient understood. Left knee for gel.

## 2019-10-23 MED FILL — TESTOSTERONE 20.25 MG/ACT (: 20.25 MG/AC | 30 days supply | Qty: 75 | Fill #3

## 2019-10-25 ENCOUNTER — Ambulatory Visit (INDEPENDENT_AMBULATORY_CARE_PROVIDER_SITE_OTHER): Payer: 59 | Admitting: Orthopedic Surgery

## 2019-10-25 ENCOUNTER — Other Ambulatory Visit: Payer: Self-pay

## 2019-10-25 DIAGNOSIS — M1711 Unilateral primary osteoarthritis, right knee: Secondary | ICD-10-CM

## 2019-10-26 ENCOUNTER — Encounter: Payer: Self-pay | Admitting: Orthopedic Surgery

## 2019-10-26 DIAGNOSIS — M1711 Unilateral primary osteoarthritis, right knee: Secondary | ICD-10-CM | POA: Diagnosis not present

## 2019-10-26 MED ORDER — LIDOCAINE HCL 1 % IJ SOLN
5.0000 mL | INTRAMUSCULAR | Status: AC | PRN
  Administered 2019-10-26: 5 mL

## 2019-10-26 MED ORDER — BUPIVACAINE HCL 0.25 % IJ SOLN
4.0000 mL | INTRAMUSCULAR | Status: AC | PRN
  Administered 2019-10-26: 4 mL via INTRA_ARTICULAR

## 2019-10-26 MED ORDER — METHYLPREDNISOLONE ACETATE 40 MG/ML IJ SUSP
40.0000 mg | INTRAMUSCULAR | Status: AC | PRN
  Administered 2019-10-26: 40 mg via INTRA_ARTICULAR

## 2019-10-26 NOTE — Progress Notes (Signed)
Office Visit Note   Patient: [name]           Date of Birth: 05-10-1951           MRN: [id number] Visit Date: 10/25/2019 Requested by: Practice, [place],  [place] [id number] PCP: [facility]  Subjective: Chief Complaint  Patient presents with  . Knee Pain    HPI: [name] is a patient with right knee pain.  Has known history of right knee arthritis.  Would like to get a cortisone injection today.  These have helped him in the past.  Handicap sticker also filled out today because he does have difficulty walking more than 200 yards without stopping.              ROS: All systems reviewed are negative as they relate to the chief complaint within the history of present illness.  Patient denies  fevers or chills.   Assessment & Plan: Visit Diagnoses:  1. Arthritis of right knee     Plan: Impression is bilateral knee arthritis right worse than left.  Right knee has been more symptomatic for him lately.  Plan is right knee cortisone injection which will give him some pain relief hopefully over the holidays.  Follow-up with me as needed.  Follow-Up Instructions: Return if symptoms worsen or fail to improve.   Orders:  No orders of the defined types were placed in this encounter.  No orders of the defined types were placed in this encounter.     Procedures: Large Joint Inj on 10/26/2019 8:56 AM Indications: diagnostic evaluation, joint swelling and pain Details: 18 G 1.5 in needle, superolateral approach  Arthrogram: No  Medications: 5 mL lidocaine 1 %; 40 mg methylPREDNISolone acetate 40 MG/ML; 4 mL bupivacaine 0.25 % Outcome: tolerated well, no immediate complications Procedure, treatment alternatives, risks and benefits explained, specific risks discussed. Consent was given by the patient. Immediately prior to procedure a time out was called to verify the correct patient, procedure, equipment, support staff  and site/side marked as required. Patient was prepped and draped in the usual sterile fashion.       Clinical Data: No additional findings.  Objective: Vital Signs: There were no vitals taken for this visit.  Physical Exam:   Constitutional: Patient appears well-developed HEENT:  Head: Normocephalic Eyes:EOM are normal Neck: Normal range of motion Cardiovascular: Normal rate Pulmonary/chest: Effort normal Neurologic: Patient is alert Skin: Skin is warm Psychiatric: Patient has normal mood and affect    Ortho Exam: Ortho exam demonstrates full active and passive range of motion of the ankles and hips.  Both knees have slight flexion contracture and mild effusion.  Extensor mechanism is intact.  Medial joint line tenderness is present bilaterally.  Patient does have flexion fairly easily past 90 degrees.  No other masses lymphadenopathy or skin changes noted in bilateral knee region  Specialty Comments:  No specialty comments available.  Imaging: No results found.   PMFS History: Patient Active Problem List   Diagnosis Date Noted  . Infected cyst of skin 08/19/2012    Class: Chronic   Past Medical History:  Diagnosis Date  . GERD (gastroesophageal reflux disease)   . History of adenomatous polyp of colon    2004  hyperplastic and tubular adenoma/  2012 hyperplastic  . History of aspiration pneumonitis    03/ 2015  . HSV-1 (herpes simplex virus 1) infection    chronic fever blister's  . Hyperlipidemia   .  Phimosis   . Snores    wears snore night gaurd /  per pt no sleep study  . Wears glasses     History reviewed. No pertinent family history.  Past Surgical History:  Procedure Laterality Date  . ARTHROSCOPIC REPAIR ACL Left 1992  . CIRCUMCISION N/A 08/06/2016   Procedure: CIRCUMCISION ADULT;  Surgeon: Ardis Hughs, MD;  Location[facility];  Service: Urology;  Laterality: N/A;  . COLONOSCOPY  last one 05-29-2011  . EAR CYST EXCISION   08/19/2012   Procedure: CYST REMOVAL;  Surgeon: Jerrell Belfast, MD;  Location: [facility];  Service: ENT;  Laterality: Left;  excision of left  infra auricular cyst   . INGUINAL HERNIA REPAIR Right 1980's  . KNEE ARTHROSCOPY Left 04/22/2006   Social History   Occupational History  . Not on file  Tobacco Use  . Smoking status: Never Smoker  . Smokeless tobacco: Never Used  Substance and Sexual Activity  . Alcohol use: No  . Drug use: No  . Sexual activity: Yes

## 2019-11-20 MED FILL — TESTOSTERONE 20.25 MG/ACT (: 20.25 MG/AC | 30 days supply | Qty: 75 | Fill #4

## 2019-11-23 MED FILL — FAMCICLOVIR 250 MG TAB: 250 | 45 days supply | Qty: 90 | Fill #1

## 2019-12-05 ENCOUNTER — Telehealth: Payer: Self-pay

## 2019-12-05 NOTE — Telephone Encounter (Signed)
Submitted VOB for Durolane, Bilateral knee.

## 2019-12-06 ENCOUNTER — Telehealth: Payer: Self-pay

## 2019-12-06 NOTE — Telephone Encounter (Signed)
Talked with patient and advised him that he is approved for gel injections.  Patient stated that he would like to hold off due to knees feeling better and that he will call back to schedule after 12/31/2019 if starts having issues again.  Approved for Durolane, bilateral knee. [name] Patient is covered at 100% of the allowable amount. Co-pay of $50.00 No PA required

## 2019-12-25 MED FILL — SIMVASTATIN 40 MG TABLET: 40 | 90 days supply | Qty: 90 | Fill #0

## 2020-01-01 MED FILL — TESTOSTERONE 20.25 MG/ACT (: 20.25 MG/AC | 30 days supply | Qty: 75 | Fill #5

## 2020-01-02 DIAGNOSIS — Z79899 Other long term (current) drug therapy: Secondary | ICD-10-CM | POA: Diagnosis not present

## 2020-01-02 DIAGNOSIS — E349 Endocrine disorder, unspecified: Secondary | ICD-10-CM | POA: Diagnosis not present

## 2020-01-02 DIAGNOSIS — E7800 Pure hypercholesterolemia, unspecified: Secondary | ICD-10-CM | POA: Diagnosis not present

## 2020-01-25 DIAGNOSIS — A6000 Herpesviral infection of urogenital system, unspecified: Secondary | ICD-10-CM | POA: Diagnosis not present

## 2020-01-25 DIAGNOSIS — Z9181 History of falling: Secondary | ICD-10-CM | POA: Diagnosis not present

## 2020-01-25 DIAGNOSIS — Z6837 Body mass index (BMI) 37.0-37.9, adult: Secondary | ICD-10-CM | POA: Diagnosis not present

## 2020-01-25 DIAGNOSIS — E349 Endocrine disorder, unspecified: Secondary | ICD-10-CM | POA: Diagnosis not present

## 2020-01-25 DIAGNOSIS — Z1331 Encounter for screening for depression: Secondary | ICD-10-CM | POA: Diagnosis not present

## 2020-01-25 DIAGNOSIS — E7800 Pure hypercholesterolemia, unspecified: Secondary | ICD-10-CM | POA: Diagnosis not present

## 2020-02-02 MED FILL — TESTOSTERONE 20.25 MG/ACT (: 20.25 MG/AC | 30 days supply | Qty: 75 | Fill #0

## 2020-02-26 ENCOUNTER — Telehealth: Payer: Self-pay | Admitting: Orthopedic Surgery

## 2020-02-26 NOTE — Telephone Encounter (Signed)
Patient would like to get the Gel injection (Durolane) that he was approved for back in February 2021.  At the time he did not need but do need at the present time. Can we recheck to make sure [name] is still valid.

## 2020-02-26 NOTE — Telephone Encounter (Signed)
Still good. Called and scheduled pt for tomorrow with dr. Marlou Sa. Pt is aware of copay

## 2020-02-26 NOTE — Telephone Encounter (Signed)
Do we need to resubmit? Or is auth from February still valid?

## 2020-02-27 ENCOUNTER — Encounter: Payer: Self-pay | Admitting: Orthopedic Surgery

## 2020-02-27 ENCOUNTER — Ambulatory Visit: Payer: 59 | Admitting: Orthopedic Surgery

## 2020-02-27 ENCOUNTER — Other Ambulatory Visit: Payer: Self-pay

## 2020-02-27 DIAGNOSIS — M1712 Unilateral primary osteoarthritis, left knee: Secondary | ICD-10-CM

## 2020-02-28 ENCOUNTER — Encounter: Payer: Self-pay | Admitting: Orthopedic Surgery

## 2020-02-28 DIAGNOSIS — M1712 Unilateral primary osteoarthritis, left knee: Secondary | ICD-10-CM | POA: Diagnosis not present

## 2020-02-28 MED ORDER — METHYLPREDNISOLONE ACETATE 40 MG/ML IJ SUSP
40.0000 mg | INTRAMUSCULAR | Status: AC | PRN
  Administered 2020-02-28: 40 mg via INTRA_ARTICULAR

## 2020-02-28 MED ORDER — BUPIVACAINE HCL 0.25 % IJ SOLN
4.0000 mL | INTRAMUSCULAR | Status: AC | PRN
  Administered 2020-02-28: 4 mL via INTRA_ARTICULAR

## 2020-02-28 MED ORDER — LIDOCAINE HCL 1 % IJ SOLN
5.0000 mL | INTRAMUSCULAR | Status: AC | PRN
  Administered 2020-02-28: 5 mL

## 2020-02-28 NOTE — Progress Notes (Signed)
   Procedure Note  Patient: [name]             Date of Birth: 1951-09-16           MRN: [id number]             Visit Date: 02/27/2020  Procedures: Visit Diagnoses:  1. Unilateral primary osteoarthritis, left knee     Large Joint Inj: L knee on 02/28/2020 8:25 PM Indications: diagnostic evaluation, joint swelling and pain Details: 18 G 1.5 in needle, superolateral approach  Arthrogram: No  Medications: 5 mL lidocaine 1 %; 40 mg methylPREDNISolone acetate 40 MG/ML; 4 mL bupivacaine 0.25 % Outcome: tolerated well, no immediate complications Procedure, treatment alternatives, risks and benefits explained, specific risks discussed. Consent was given by the patient. Immediately prior to procedure a time out was called to verify the correct patient, procedure, equipment, support staff and site/side marked as required. Patient was prepped and draped in the usual sterile fashion.

## 2020-02-29 DIAGNOSIS — H2513 Age-related nuclear cataract, bilateral: Secondary | ICD-10-CM | POA: Diagnosis not present

## 2020-02-29 DIAGNOSIS — H25813 Combined forms of age-related cataract, bilateral: Secondary | ICD-10-CM | POA: Diagnosis not present

## 2020-02-29 DIAGNOSIS — H5203 Hypermetropia, bilateral: Secondary | ICD-10-CM | POA: Diagnosis not present

## 2020-02-29 DIAGNOSIS — H52223 Regular astigmatism, bilateral: Secondary | ICD-10-CM | POA: Diagnosis not present

## 2020-02-29 DIAGNOSIS — H524 Presbyopia: Secondary | ICD-10-CM | POA: Diagnosis not present

## 2020-03-04 MED FILL — TESTOSTERONE 20.25 MG/ACT (: 20.25 MG/AC | 30 days supply | Qty: 75 | Fill #1

## 2020-03-04 MED FILL — FAMCICLOVIR 250 MG TABLET: 250 | 45 days supply | Qty: 90 | Fill #2

## 2020-03-25 MED FILL — SIMVASTATIN 40 MG TABLET: 40 | 90 days supply | Qty: 90 | Fill #1

## 2020-04-04 MED FILL — TESTOSTERONE 20.25 MG/ACT (: 20.25 MG/AC | 30 days supply | Qty: 75 | Fill #2

## 2020-05-06 MED FILL — TESTOSTERONE 20.25 MG/ACT (: 20.25 MG/AC | 30 days supply | Qty: 75 | Fill #3

## 2020-05-06 MED FILL — FAMCICLOVIR 250 MG TABLET: 250 | 45 days supply | Qty: 90 | Fill #3

## 2020-05-29 ENCOUNTER — Ambulatory Visit: Payer: 59 | Admitting: Orthopedic Surgery

## 2020-05-31 ENCOUNTER — Encounter: Payer: Self-pay | Admitting: Orthopedic Surgery

## 2020-05-31 ENCOUNTER — Ambulatory Visit: Payer: 59 | Admitting: Orthopedic Surgery

## 2020-05-31 DIAGNOSIS — M1712 Unilateral primary osteoarthritis, left knee: Secondary | ICD-10-CM

## 2020-05-31 MED ORDER — BUPIVACAINE HCL 0.25 % IJ SOLN
4.0000 mL | INTRAMUSCULAR | Status: AC | PRN
  Administered 2020-05-31: 4 mL via INTRA_ARTICULAR

## 2020-05-31 MED ORDER — LIDOCAINE HCL 1 % IJ SOLN
5.0000 mL | INTRAMUSCULAR | Status: AC | PRN
  Administered 2020-05-31: 5 mL

## 2020-05-31 MED ORDER — METHYLPREDNISOLONE ACETATE 40 MG/ML IJ SUSP
40.0000 mg | INTRAMUSCULAR | Status: AC | PRN
  Administered 2020-05-31: 40 mg via INTRA_ARTICULAR

## 2020-05-31 NOTE — Progress Notes (Signed)
Office Visit Note   Patient: [name]           Date of Birth: [birth year]           MRN: [id number] Visit Date: 05/31/2020 Requested by: Practice, [place],  [place] [address] PCP: [facility]  Subjective: Chief Complaint  Patient presents with  . Knee Pain    HPI: [name] is a 69 year old patient with bilateral knee pain left worse than right.  Had cortisone injection 02/27/2020.  Pretty good relief from that.  Is having a lot of left knee pain now.  Pain does not wake him from sleep.  The pain is bad after multiple 12-hour shifts at work.  Uses Voltaren cream and also takes Aleve 2 tablets once a day.  He is doing home exercise program.  Walking downhill is difficult and also he has to back down the stairs.              ROS: All systems reviewed are negative as they relate to the chief complaint within the history of present illness.  Patient denies  fevers or chills.   Assessment & Plan: Visit Diagnoses:  1. Unilateral primary osteoarthritis, left knee     Plan: Impression is left greater than right knee arthritis.  Plan is cortisone injection today for pain relief.  Continue with nonweightbearing quad strengthening exercises.  May need knee replacement sometime in the future.  Could consider gel injection which she has had before as a next step if his symptoms return before 4 months.  Follow-up as needed.  Follow-Up Instructions: Return if symptoms worsen or fail to improve.   Orders:  No orders of the defined types were placed in this encounter.  No orders of the defined types were placed in this encounter.     Procedures: Large Joint Inj: L knee on 05/31/2020 9:15 AM Indications: diagnostic evaluation, joint swelling and pain Details: 18 G 1.5 in needle, superolateral approach  Arthrogram: No  Medications: 5 mL lidocaine 1 %; 40 mg methylPREDNISolone acetate 40 MG/ML; 4 mL bupivacaine 0.25 % Outcome:  tolerated well, no immediate complications Procedure, treatment alternatives, risks and benefits explained, specific risks discussed. Consent was given by the patient. Immediately prior to procedure a time out was called to verify the correct patient, procedure, equipment, support staff and site/side marked as required. Patient was prepped and draped in the usual sterile fashion.       Clinical Data: No additional findings.  Objective: Vital Signs: There were no vitals taken for this visit.  Physical Exam:   Constitutional: Patient appears well-developed HEENT:  Head: Normocephalic Eyes:EOM are normal Neck: Normal range of motion Cardiovascular: Normal rate Pulmonary/chest: Effort normal Neurologic: Patient is alert Skin: Skin is warm Psychiatric: Patient has normal mood and affect    Ortho Exam: Ortho exam demonstrates full active and passive range of motion of both knees.  No effusion in either knee today.  Has global knee tenderness on the left-hand side.  No groin pain with internal or external rotation of the leg.  No masses lymphadenopathy or skin changes noted in that left knee region.  Baker's cyst which has been more prominent before not palpable today.  Specialty Comments:  No specialty comments available.  Imaging: No results found.   PMFS History: Patient Active Problem List   Diagnosis Date Noted  . Infected cyst of skin 08/19/2012    Class: Chronic   Past Medical History:  Diagnosis Date  . GERD (gastroesophageal reflux disease)   . History of adenomatous polyp of colon    2004  hyperplastic and tubular adenoma/  2012 hyperplastic  . History of aspiration pneumonitis    03/ 2015  . HSV-1 (herpes simplex virus 1) infection    chronic fever blister's  . Hyperlipidemia   . Phimosis   . Snores    wears snore night gaurd /  per pt no sleep study  . Wears glasses     History reviewed. No pertinent family history.  Past Surgical History:  Procedure  Laterality Date  . ARTHROSCOPIC REPAIR ACL Left 1992  . CIRCUMCISION N/A 08/06/2016   Procedure: CIRCUMCISION ADULT;  Surgeon: Ardis Hughs, MD;  Location: [facility];  Service: Urology;  Laterality: N/A;  . COLONOSCOPY  last one 05-29-2011  . EAR CYST EXCISION  08/19/2012   Procedure: CYST REMOVAL;  Surgeon: Jerrell Belfast, MD;  Location: [facility];  Service: ENT;  Laterality: Left;  excision of left  infra auricular cyst   . INGUINAL HERNIA REPAIR Right 1980's  . KNEE ARTHROSCOPY Left 04/22/2006   Social History   Occupational History  . Not on file  Tobacco Use  . Smoking status: Never Smoker  . Smokeless tobacco: Never Used  Substance and Sexual Activity  . Alcohol use: No  . Drug use: No  . Sexual activity: Yes

## 2020-06-04 MED FILL — TESTOSTERONE 20.25 MG/ACT (: 20.25 MG/AC | 30 days supply | Qty: 75 | Fill #4

## 2020-06-20 ENCOUNTER — Other Ambulatory Visit (HOSPITAL_COMMUNITY): Payer: Self-pay | Admitting: Physician Assistant

## 2020-06-20 MED FILL — SIMVASTATIN 40 MG TABLET: 40 | 90 days supply | Qty: 90 | Fill #0

## 2020-06-26 DIAGNOSIS — Z20828 Contact with and (suspected) exposure to other viral communicable diseases: Secondary | ICD-10-CM | POA: Diagnosis not present

## 2020-07-01 MED FILL — FAMCICLOVIR 250 MG TABLET: 250 | 45 days supply | Qty: 90 | Fill #4

## 2020-07-02 MED FILL — TESTOSTERONE 20.25 MG/ACT (: 20.25 MG/AC | 30 days supply | Qty: 75 | Fill #5

## 2020-07-29 DIAGNOSIS — E7800 Pure hypercholesterolemia, unspecified: Secondary | ICD-10-CM | POA: Diagnosis not present

## 2020-07-29 DIAGNOSIS — Z20822 Contact with and (suspected) exposure to covid-19: Secondary | ICD-10-CM | POA: Diagnosis not present

## 2020-07-29 DIAGNOSIS — E349 Endocrine disorder, unspecified: Secondary | ICD-10-CM | POA: Diagnosis not present

## 2020-07-29 DIAGNOSIS — Z125 Encounter for screening for malignant neoplasm of prostate: Secondary | ICD-10-CM | POA: Diagnosis not present

## 2020-07-29 DIAGNOSIS — Z79899 Other long term (current) drug therapy: Secondary | ICD-10-CM | POA: Diagnosis not present

## 2020-07-31 ENCOUNTER — Other Ambulatory Visit (HOSPITAL_COMMUNITY): Payer: Self-pay | Admitting: Physician Assistant

## 2020-07-31 DIAGNOSIS — Z139 Encounter for screening, unspecified: Secondary | ICD-10-CM | POA: Diagnosis not present

## 2020-07-31 DIAGNOSIS — E349 Endocrine disorder, unspecified: Secondary | ICD-10-CM | POA: Diagnosis not present

## 2020-07-31 DIAGNOSIS — E7800 Pure hypercholesterolemia, unspecified: Secondary | ICD-10-CM | POA: Diagnosis not present

## 2020-07-31 DIAGNOSIS — Z6836 Body mass index (BMI) 36.0-36.9, adult: Secondary | ICD-10-CM | POA: Diagnosis not present

## 2020-07-31 DIAGNOSIS — A6000 Herpesviral infection of urogenital system, unspecified: Secondary | ICD-10-CM | POA: Diagnosis not present

## 2020-07-31 MED FILL — TESTOSTERONE 20.25 MG/ACT (: 20.25 MG/AC | 30 days supply | Qty: 75 | Fill #0

## 2020-08-30 MED FILL — FAMCICLOVIR 250 MG TABLET: 250 | 90 days supply | Qty: 180 | Fill #0

## 2020-09-13 MED FILL — SIMVASTATIN 40 MG TABLET: 40 | 90 days supply | Qty: 90 | Fill #1

## 2020-09-13 MED FILL — TESTOSTERONE 1.62 % GEL: 1.62 | 30 days supply | Qty: 75 | Fill #1

## 2020-10-10 ENCOUNTER — Encounter: Payer: Self-pay | Admitting: Orthopedic Surgery

## 2020-10-10 ENCOUNTER — Ambulatory Visit: Payer: 59 | Admitting: Orthopedic Surgery

## 2020-10-10 DIAGNOSIS — M1712 Unilateral primary osteoarthritis, left knee: Secondary | ICD-10-CM | POA: Diagnosis not present

## 2020-10-10 MED ORDER — BUPIVACAINE HCL 0.25 % IJ SOLN
4.0000 mL | INTRAMUSCULAR | Status: AC | PRN
  Administered 2020-10-10: 4 mL via INTRA_ARTICULAR

## 2020-10-10 MED ORDER — METHYLPREDNISOLONE ACETATE 40 MG/ML IJ SUSP
40.0000 mg | INTRAMUSCULAR | Status: AC | PRN
  Administered 2020-10-10: 40 mg via INTRA_ARTICULAR

## 2020-10-10 MED ORDER — LIDOCAINE HCL 1 % IJ SOLN
5.0000 mL | INTRAMUSCULAR | Status: AC | PRN
  Administered 2020-10-10: 5 mL

## 2020-10-10 NOTE — Progress Notes (Signed)
   Procedure Note  Patient: [name]             Date of Birth: Jan 13, 1951           MRN: [id number]             Visit Date: 10/10/2020  Procedures: Visit Diagnoses:  1. Unilateral primary osteoarthritis, left knee     Large Joint Inj: L knee on 10/10/2020 4:37 PM Indications: diagnostic evaluation, joint swelling and pain Details: 18 G 1.5 in needle, superolateral approach  Arthrogram: No  Medications: 5 mL lidocaine 1 %; 40 mg methylPREDNISolone acetate 40 MG/ML; 4 mL bupivacaine 0.25 % Outcome: tolerated well, no immediate complications Procedure, treatment alternatives, risks and benefits explained, specific risks discussed. Consent was given by the patient. Immediately prior to procedure a time out was called to verify the correct patient, procedure, equipment, support staff and site/side marked as required. Patient was prepped and draped in the usual sterile fashion.     [name] presents with continued left knee pain.  In general he is managing well.  Episodic cortisone injections have given him enough relief to delay knee replacement.  He has no effusion today and is walking without a limp.  I think he will need knee replacement at sometime in the future but for now he is managing well with injections and activity modification and exercise.

## 2020-10-16 DIAGNOSIS — H2513 Age-related nuclear cataract, bilateral: Secondary | ICD-10-CM | POA: Diagnosis not present

## 2020-10-16 DIAGNOSIS — H52223 Regular astigmatism, bilateral: Secondary | ICD-10-CM | POA: Diagnosis not present

## 2020-10-16 DIAGNOSIS — H524 Presbyopia: Secondary | ICD-10-CM | POA: Diagnosis not present

## 2020-10-24 DIAGNOSIS — Z20822 Contact with and (suspected) exposure to covid-19: Secondary | ICD-10-CM | POA: Diagnosis not present

## 2020-10-31 MED FILL — TESTOSTERONE 1.62 % GEL: 1.62 | 30 days supply | Qty: 75 | Fill #2

## 2020-12-21 MED FILL — TESTOSTERONE 1.62 % GEL: 1.62 | 30 days supply | Qty: 75 | Fill #3

## 2020-12-21 MED FILL — SIMVASTATIN 40 MG TABLET: 40 | 90 days supply | Qty: 90 | Fill #0

## 2021-01-28 DIAGNOSIS — Z20822 Contact with and (suspected) exposure to covid-19: Secondary | ICD-10-CM | POA: Diagnosis not present

## 2021-01-28 DIAGNOSIS — E349 Endocrine disorder, unspecified: Secondary | ICD-10-CM | POA: Diagnosis not present

## 2021-01-28 DIAGNOSIS — Z79899 Other long term (current) drug therapy: Secondary | ICD-10-CM | POA: Diagnosis not present

## 2021-01-28 DIAGNOSIS — E7800 Pure hypercholesterolemia, unspecified: Secondary | ICD-10-CM | POA: Diagnosis not present

## 2021-01-30 ENCOUNTER — Other Ambulatory Visit (HOSPITAL_COMMUNITY): Payer: Self-pay | Admitting: Physician Assistant

## 2021-01-30 DIAGNOSIS — Z6836 Body mass index (BMI) 36.0-36.9, adult: Secondary | ICD-10-CM | POA: Diagnosis not present

## 2021-01-30 DIAGNOSIS — E7800 Pure hypercholesterolemia, unspecified: Secondary | ICD-10-CM | POA: Diagnosis not present

## 2021-01-30 DIAGNOSIS — Z1331 Encounter for screening for depression: Secondary | ICD-10-CM | POA: Diagnosis not present

## 2021-01-30 DIAGNOSIS — E349 Endocrine disorder, unspecified: Secondary | ICD-10-CM | POA: Diagnosis not present

## 2021-01-30 DIAGNOSIS — Z9181 History of falling: Secondary | ICD-10-CM | POA: Diagnosis not present

## 2021-01-30 DIAGNOSIS — A6000 Herpesviral infection of urogenital system, unspecified: Secondary | ICD-10-CM | POA: Diagnosis not present

## 2021-01-30 MED FILL — TESTOSTERONE 1.62 % GEL: 1.62 | 30 days supply | Qty: 75 | Fill #0

## 2021-01-30 MED FILL — FAMCICLOVIR 250 MG TABLET: 250 | 90 days supply | Qty: 180 | Fill #0

## 2021-03-24 ENCOUNTER — Other Ambulatory Visit (HOSPITAL_COMMUNITY): Payer: Self-pay

## 2021-03-24 MED FILL — Testosterone TD Gel 20.25 MG/ACT (1.62%): TRANSDERMAL | 30 days supply | Qty: 75 | Fill #0 | Status: AC

## 2021-03-24 MED FILL — Simvastatin Tab 40 MG: ORAL | 90 days supply | Qty: 90 | Fill #0 | Status: AC

## 2021-03-26 DIAGNOSIS — H524 Presbyopia: Secondary | ICD-10-CM | POA: Diagnosis not present

## 2021-03-26 DIAGNOSIS — H52223 Regular astigmatism, bilateral: Secondary | ICD-10-CM | POA: Diagnosis not present

## 2021-03-26 DIAGNOSIS — H2513 Age-related nuclear cataract, bilateral: Secondary | ICD-10-CM | POA: Diagnosis not present

## 2021-04-02 ENCOUNTER — Ambulatory Visit: Payer: 59 | Admitting: Orthopedic Surgery

## 2021-04-02 ENCOUNTER — Telehealth: Payer: Self-pay

## 2021-04-02 DIAGNOSIS — M1711 Unilateral primary osteoarthritis, right knee: Secondary | ICD-10-CM

## 2021-04-02 DIAGNOSIS — M1712 Unilateral primary osteoarthritis, left knee: Secondary | ICD-10-CM

## 2021-04-02 NOTE — Telephone Encounter (Signed)
Can we please get auth for left knee gel injection?

## 2021-04-04 ENCOUNTER — Encounter: Payer: Self-pay | Admitting: Orthopedic Surgery

## 2021-04-04 MED ORDER — BETAMETHASONE SOD PHOS & ACET 6 (3-3) MG/ML IJ SUSP
6.0000 mg | INTRAMUSCULAR | Status: AC | PRN
Start: 2021-04-02 — End: 2021-04-02
  Administered 2021-04-02: 6 mg via INTRA_ARTICULAR

## 2021-04-04 MED ORDER — BUPIVACAINE HCL 0.25 % IJ SOLN
4.0000 mL | INTRAMUSCULAR | Status: AC | PRN
  Administered 2021-04-02: 4 mL via INTRA_ARTICULAR

## 2021-04-04 MED ORDER — LIDOCAINE HCL 1 % IJ SOLN
5.0000 mL | INTRAMUSCULAR | Status: AC | PRN
  Administered 2021-04-02: 5 mL

## 2021-04-04 NOTE — Progress Notes (Signed)
Office Visit Note   Patient: [name]           Date of Birth: [birth year]           MRN: [id number] Visit Date: 04/02/2021 Requested by: Practice, [place],  [place] [address] PCP: [facility]  Subjective: Chief Complaint  Patient presents with  . Left Knee - Pain    HPI: [name] is a 70 year old patient with left knee arthritis.  Planning on going on vacation for about 2 weeks to [facility].  Not going to be a particular physical type of vacation.  Right knee is doing reasonably well but left knee is giving him difficulty.  Denies any interval history or mechanical symptoms.              ROS: All systems reviewed are negative as they relate to the chief complaint within the history of present illness.  Patient denies  fevers or chills.   Assessment & Plan: Visit Diagnoses:  1. Arthritis of right knee     Plan: Impression is left knee pain and arthritis.  Plan is cortisone injection today.  He is doing well with cortisone injections and I think would also do well with gel injections.  When his cortisone wears off we will get him preapproved for gel injection.  He has had good relief in the past with the gel injections but it has not lasted more than 6 months.  Follow-Up Instructions: Return if symptoms worsen or fail to improve.   Orders:  No orders of the defined types were placed in this encounter.  No orders of the defined types were placed in this encounter.     Procedures: Large Joint Inj: L knee on 04/02/2021 11:03 PM Indications: diagnostic evaluation, joint swelling and pain Details: 18 G 1.5 in needle, superolateral approach  Arthrogram: No  Medications: 5 mL lidocaine 1 %; 4 mL bupivacaine 0.25 %; 6 mg betamethasone acetate-betamethasone sodium phosphate 6 (3-3) MG/ML Outcome: tolerated well, no immediate complications Procedure, treatment alternatives, risks and benefits explained,  specific risks discussed. Consent was given by the patient. Immediately prior to procedure a time out was called to verify the correct patient, procedure, equipment, support staff and site/side marked as required. Patient was prepped and draped in the usual sterile fashion.       Clinical Data: No additional findings.  Objective: Vital Signs: There were no vitals taken for this visit.  Physical Exam:   Constitutional: Patient appears well-developed HEENT:  Head: Normocephalic Eyes:EOM are normal Neck: Normal range of motion Cardiovascular: Normal rate Pulmonary/chest: Effort normal Neurologic: Patient is alert Skin: Skin is warm Psychiatric: Patient has normal mood and affect    Ortho Exam: Ortho exam demonstrates no effusion in the left knee.  Pretty good range of motion with medial joint line tenderness but stable collateral and cruciate ligaments.  No masses lymphadenopathy or skin changes noted in that left knee region.  Range of motion 0 to about 115.  No groin pain with internal ex rotation of the leg.  Specialty Comments:  No specialty comments available.  Imaging: No results found.   PMFS History: Patient Active Problem List   Diagnosis Date Noted  . Infected cyst of skin 08/19/2012    Class: Chronic   Past Medical History:  Diagnosis Date  . GERD (gastroesophageal reflux disease)   . History of adenomatous polyp of colon    2004  hyperplastic and tubular  adenoma/  2012 hyperplastic  . History of aspiration pneumonitis    03/ 2015  . HSV-1 (herpes simplex virus 1) infection    chronic fever blister's  . Hyperlipidemia   . Phimosis   . Snores    wears snore night gaurd /  per pt no sleep study  . Wears glasses     History reviewed. No pertinent family history.  Past Surgical History:  Procedure Laterality Date  . ARTHROSCOPIC REPAIR ACL Left 1992  . CIRCUMCISION N/A 08/06/2016   Procedure: CIRCUMCISION ADULT;  Surgeon: Ardis Hughs, MD;   Location: [facility];  Service: Urology;  Laterality: N/A;  . COLONOSCOPY  last one 05-29-2011  . EAR CYST EXCISION  08/19/2012   Procedure: CYST REMOVAL;  Surgeon: Jerrell Belfast, MD;  Location: [facility];  Service: ENT;  Laterality: Left;  excision of left  infra auricular cyst   . INGUINAL HERNIA REPAIR Right 1980's  . KNEE ARTHROSCOPY Left 04/22/2006   Social History   Occupational History  . Not on file  Tobacco Use  . Smoking status: Never Smoker  . Smokeless tobacco: Never Used  Substance and Sexual Activity  . Alcohol use: No  . Drug use: No  . Sexual activity: Yes

## 2021-04-04 NOTE — Telephone Encounter (Signed)
Noted  

## 2021-04-07 ENCOUNTER — Telehealth: Payer: Self-pay

## 2021-04-07 NOTE — Telephone Encounter (Signed)
VOB has been submitted for Durolane, left knee. Pending BV.

## 2021-04-08 ENCOUNTER — Telehealth: Payer: Self-pay

## 2021-04-08 ENCOUNTER — Telehealth: Payer: Self-pay | Admitting: Orthopedic Surgery

## 2021-04-08 NOTE — Telephone Encounter (Signed)
FYI Patient called advised he got the cortisone injection last week and want to wait at least 3 months before he get the gel injection. Patient did not schedule an appointment at this time.  Patient said Dr. Marlou Sa wanted to go ahead and get the injection medication ordered.  The number to contact patient if needed is [phone]

## 2021-04-08 NOTE — Telephone Encounter (Signed)
Called and left a VM for patient to CB to schedule with Dr. Marlou Sa for gel injection.  Approved for Durolane, left knee. Buy & Bill Must meet deductible first Patient will be responsible for 20% OOP. Co-pay of $50.00 No PA required

## 2021-04-08 NOTE — Telephone Encounter (Signed)
Noted  

## 2021-05-20 MED FILL — Testosterone TD Gel 20.25 MG/ACT (1.62%): TRANSDERMAL | 30 days supply | Qty: 75 | Fill #1 | Status: AC

## 2021-05-21 ENCOUNTER — Other Ambulatory Visit (HOSPITAL_COMMUNITY): Payer: Self-pay

## 2021-06-19 ENCOUNTER — Other Ambulatory Visit (HOSPITAL_COMMUNITY): Payer: Self-pay

## 2021-06-20 ENCOUNTER — Other Ambulatory Visit (HOSPITAL_COMMUNITY): Payer: Self-pay

## 2021-06-21 ENCOUNTER — Other Ambulatory Visit (HOSPITAL_COMMUNITY): Payer: Self-pay

## 2021-06-21 MED FILL — Simvastatin Tab 40 MG: ORAL | 90 days supply | Qty: 90 | Fill #0 | Status: AC

## 2021-06-23 ENCOUNTER — Other Ambulatory Visit (HOSPITAL_COMMUNITY): Payer: Self-pay

## 2021-06-24 ENCOUNTER — Other Ambulatory Visit (HOSPITAL_COMMUNITY): Payer: Self-pay

## 2021-06-24 MED ORDER — SIMVASTATIN 40 MG PO TABS
40.0000 mg | ORAL_TABLET | Freq: Every day | ORAL | 1 refills | Status: DC
  Filled 2021-06-24 – 2021-11-03 (×2): qty 90, 90d supply, fill #0

## 2021-06-28 MED FILL — Testosterone TD Gel 20.25 MG/ACT (1.62%): TRANSDERMAL | 30 days supply | Qty: 75 | Fill #2 | Status: CN

## 2021-06-30 ENCOUNTER — Other Ambulatory Visit (HOSPITAL_COMMUNITY): Payer: Self-pay

## 2021-07-08 ENCOUNTER — Other Ambulatory Visit (HOSPITAL_COMMUNITY): Payer: Self-pay

## 2021-07-08 MED FILL — Famciclovir Tab 250 MG: ORAL | 90 days supply | Qty: 180 | Fill #0 | Status: AC

## 2021-07-09 ENCOUNTER — Other Ambulatory Visit (HOSPITAL_COMMUNITY): Payer: Self-pay

## 2021-08-06 ENCOUNTER — Ambulatory Visit: Payer: 59 | Admitting: Orthopedic Surgery

## 2021-08-06 ENCOUNTER — Other Ambulatory Visit: Payer: Self-pay

## 2021-08-06 ENCOUNTER — Encounter: Payer: Self-pay | Admitting: Orthopedic Surgery

## 2021-08-06 DIAGNOSIS — M1711 Unilateral primary osteoarthritis, right knee: Secondary | ICD-10-CM

## 2021-08-10 ENCOUNTER — Encounter: Payer: Self-pay | Admitting: Orthopedic Surgery

## 2021-08-10 MED ORDER — SODIUM HYALURONATE 60 MG/3ML IX PRSY
60.0000 mg | PREFILLED_SYRINGE | INTRA_ARTICULAR | Status: AC | PRN
  Administered 2021-08-06: 60 mg via INTRA_ARTICULAR

## 2021-08-10 MED ORDER — LIDOCAINE HCL 1 % IJ SOLN
5.0000 mL | INTRAMUSCULAR | Status: AC | PRN
  Administered 2021-08-06: 5 mL

## 2021-08-10 NOTE — Progress Notes (Signed)
   Procedure Note  Patient: [name]             Date of Birth: [birth year]           MRN: [id number]             Visit Date: 08/06/2021  Procedures: Visit Diagnoses:  1. Arthritis of right knee     Large Joint Inj: R knee on 08/06/2021 10:01 AM Indications: diagnostic evaluation, joint swelling and pain Details: 18 G 1.5 in needle, superolateral approach  Arthrogram: No  Medications: 5 mL lidocaine 1 %; 60 mg Sodium Hyaluronate 60 MG/3ML Outcome: tolerated well, no immediate complications Procedure, treatment alternatives, risks and benefits explained, specific risks discussed. Consent was given by the patient. Immediately prior to procedure a time out was called to verify the correct patient, procedure, equipment, support staff and site/side marked as required. Patient was prepped and draped in the usual sterile fashion.

## 2021-08-25 DIAGNOSIS — Z20822 Contact with and (suspected) exposure to covid-19: Secondary | ICD-10-CM | POA: Diagnosis not present

## 2021-08-28 ENCOUNTER — Other Ambulatory Visit (HOSPITAL_COMMUNITY): Payer: Self-pay

## 2021-08-28 DIAGNOSIS — E349 Endocrine disorder, unspecified: Secondary | ICD-10-CM | POA: Diagnosis not present

## 2021-08-28 DIAGNOSIS — Z23 Encounter for immunization: Secondary | ICD-10-CM | POA: Diagnosis not present

## 2021-08-28 DIAGNOSIS — E7800 Pure hypercholesterolemia, unspecified: Secondary | ICD-10-CM | POA: Diagnosis not present

## 2021-08-28 DIAGNOSIS — Z125 Encounter for screening for malignant neoplasm of prostate: Secondary | ICD-10-CM | POA: Diagnosis not present

## 2021-08-28 DIAGNOSIS — Z6835 Body mass index (BMI) 35.0-35.9, adult: Secondary | ICD-10-CM | POA: Diagnosis not present

## 2021-08-28 DIAGNOSIS — Z79899 Other long term (current) drug therapy: Secondary | ICD-10-CM | POA: Diagnosis not present

## 2021-08-28 DIAGNOSIS — A6000 Herpesviral infection of urogenital system, unspecified: Secondary | ICD-10-CM | POA: Diagnosis not present

## 2021-08-28 DIAGNOSIS — Z139 Encounter for screening, unspecified: Secondary | ICD-10-CM | POA: Diagnosis not present

## 2021-08-28 MED ORDER — FAMCICLOVIR 250 MG PO TABS
250.0000 mg | ORAL_TABLET | Freq: Two times a day (BID) | ORAL | 3 refills | Status: DC
  Filled 2021-08-28 – 2021-11-03 (×2): qty 180, 90d supply, fill #0
  Filled 2022-03-30: qty 180, 90d supply, fill #1

## 2021-08-28 MED ORDER — TESTOSTERONE 20.25 MG/ACT (1.62%) TD GEL
2.0000 | Freq: Every day | TRANSDERMAL | 5 refills | Status: DC
  Filled 2021-08-28 – 2021-11-03 (×2): qty 75, 30d supply, fill #0
  Filled 2022-02-22: qty 75, 30d supply, fill #1

## 2021-09-05 ENCOUNTER — Other Ambulatory Visit (HOSPITAL_COMMUNITY): Payer: Self-pay

## 2021-11-03 ENCOUNTER — Other Ambulatory Visit (HOSPITAL_COMMUNITY): Payer: Self-pay

## 2021-11-18 ENCOUNTER — Telehealth: Payer: Self-pay | Admitting: Gastroenterology

## 2021-11-18 NOTE — Telephone Encounter (Signed)
Hi Dr. Lyndel Safe, this patient came into the office to drop off his GI records. He stated that he is due for a colonoscopy. His last one was at [facility]. 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 I will send his records to [facility] office for your review and advise on scheduling. Thank you.

## 2021-11-26 NOTE — Telephone Encounter (Signed)
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

## 2021-11-26 NOTE — Telephone Encounter (Signed)
Pt scheduled for a Direct Colonoscopy 01/13/2022 at 8:00 with Dr. Lyndel Safe in the [facility]. Pt to arrive at 7:00 AM; Pt made aware: Pt scheduled for a previsit appointment on 12/29/2021 at 8:30: Pt made aware Pt verbalized understanding with all questions answered:

## 2021-12-24 ENCOUNTER — Other Ambulatory Visit: Payer: Self-pay

## 2021-12-24 ENCOUNTER — Ambulatory Visit: Payer: 59 | Admitting: Orthopedic Surgery

## 2021-12-24 DIAGNOSIS — M170 Bilateral primary osteoarthritis of knee: Secondary | ICD-10-CM

## 2021-12-24 DIAGNOSIS — M1711 Unilateral primary osteoarthritis, right knee: Secondary | ICD-10-CM | POA: Diagnosis not present

## 2021-12-24 DIAGNOSIS — M1712 Unilateral primary osteoarthritis, left knee: Secondary | ICD-10-CM | POA: Diagnosis not present

## 2021-12-26 ENCOUNTER — Encounter: Payer: Self-pay | Admitting: Orthopedic Surgery

## 2021-12-26 MED ORDER — BUPIVACAINE HCL 0.25 % IJ SOLN
4.0000 mL | INTRAMUSCULAR | Status: AC | PRN
Start: 2021-12-24 — End: 2021-12-24
  Administered 2021-12-24: 4 mL via INTRA_ARTICULAR

## 2021-12-26 MED ORDER — LIDOCAINE HCL 1 % IJ SOLN
5.0000 mL | INTRAMUSCULAR | Status: AC | PRN
Start: 2021-12-24 — End: 2021-12-24
  Administered 2021-12-24: 5 mL

## 2021-12-26 MED ORDER — METHYLPREDNISOLONE ACETATE 40 MG/ML IJ SUSP
40.0000 mg | INTRAMUSCULAR | Status: AC | PRN
  Administered 2021-12-24: 40 mg via INTRA_ARTICULAR

## 2021-12-26 NOTE — Progress Notes (Signed)
Office Visit Note   Patient: [name]           Date of Birth: 11/29/1950           MRN: [id number] Visit Date: 12/24/2021 Requested by: Practice, [place],  [place] [address] PCP: [facility]  Subjective: Chief Complaint  Patient presents with   Other    knee    HPI: [name] is a 71 year old patient with left knee pain.  He has known history of left knee arthritis.  He and [name] his wife are going to drive down to [place].  This is happening next week.  Cortisone shots help his knees but the gel injections did not.              ROS: All systems reviewed are negative as they relate to the chief complaint within the history of present illness.  Patient denies  fevers or chills.   Assessment & Plan: Visit Diagnoses:  1. Arthritis of right knee     Plan: Impression is left knee arthritis.  Plan is left knee cortisone injection.  Not too much effusion today which I think is a good sign.  Overall quad strength is good.  Range of motion also remains good without significant flexion contracture.  Continue with nonweightbearing quad strengthening exercises and follow-up as needed.  Follow-Up Instructions: No follow-ups on file.   Orders:  No orders of the defined types were placed in this encounter.  No orders of the defined types were placed in this encounter.     Procedures: Large Joint Inj: L knee on 12/24/2021 6:46 AM Indications: diagnostic evaluation, joint swelling and pain Details: 18 G 1.5 in needle, superolateral approach  Arthrogram: No  Medications: 5 mL lidocaine 1 %; 40 mg methylPREDNISolone acetate 40 MG/ML; 4 mL bupivacaine 0.25 % Outcome: tolerated well, no immediate complications Procedure, treatment alternatives, risks and benefits explained, specific risks discussed. Consent was given by the patient. Immediately prior to procedure a time out was called to verify the correct patient, procedure,  equipment, support staff and site/side marked as required. Patient was prepped and draped in the usual sterile fashion.      Clinical Data: No additional findings.  Objective: Vital Signs: There were no vitals taken for this visit.  Physical Exam:   Constitutional: Patient appears well-developed HEENT:  Head: Normocephalic Eyes:EOM are normal Neck: Normal range of motion Cardiovascular: Normal rate Pulmonary/chest: Effort normal Neurologic: Patient is alert Skin: Skin is warm Psychiatric: Patient has normal mood and affect   Ortho Exam: Ortho exam demonstrates palpable pedal pulses.  Fairly minimal flexion contracture on that left-hand side.  Bends to 115.  Collateral and cruciate ligaments are stable.  No masses lymphadenopathy or skin changes noted in that left knee region  Specialty Comments:  No specialty comments available.  Imaging: No results found.   PMFS History: Patient Active Problem List   Diagnosis Date Noted   Infected cyst of skin 08/19/2012    Class: Chronic   Past Medical History:  Diagnosis Date   GERD (gastroesophageal reflux disease)    History of adenomatous polyp of colon    2004  hyperplastic and tubular adenoma/  2012 hyperplastic   History of aspiration pneumonitis    03/ 2015   HSV-1 (herpes simplex virus 1) infection    chronic fever blister's   Hyperlipidemia    Phimosis    Snores    wears snore night gaurd /  per pt no sleep study   Wears glasses     No family history on file.  Past Surgical History:  Procedure Laterality Date   ARTHROSCOPIC REPAIR ACL Left 1992   CIRCUMCISION N/A 08/06/2016   Procedure: CIRCUMCISION ADULT;  Surgeon: Ardis Hughs, MD;  Location: [facility];  Service: Urology;  Laterality: N/A;   COLONOSCOPY  last one 05-29-2011   EAR CYST EXCISION  08/19/2012   Procedure: CYST REMOVAL;  Surgeon: Jerrell Belfast, MD;  Location: [facility];  Service: ENT;  Laterality: Left;   excision of left  infra auricular cyst    INGUINAL HERNIA REPAIR Right 1980's   KNEE ARTHROSCOPY Left 04/22/2006   Social History   Occupational History   Not on file  Tobacco Use   Smoking status: Never   Smokeless tobacco: Never  Substance and Sexual Activity   Alcohol use: No   Drug use: No   Sexual activity: Yes

## 2021-12-29 ENCOUNTER — Other Ambulatory Visit (HOSPITAL_COMMUNITY): Payer: Self-pay

## 2021-12-29 ENCOUNTER — Other Ambulatory Visit: Payer: Self-pay

## 2021-12-29 ENCOUNTER — Ambulatory Visit (AMBULATORY_SURGERY_CENTER): Payer: 59 | Admitting: *Deleted

## 2021-12-29 VITALS — Ht 69.0 in | Wt 240.0 lb

## 2021-12-29 DIAGNOSIS — Z86010 Personal history of colonic polyps: Secondary | ICD-10-CM

## 2021-12-29 MED ORDER — NA SULFATE-K SULFATE-MG SULF 17.5-3.13-1.6 GM/177ML PO SOLN
1.0000 | ORAL | 0 refills | Status: DC
Start: 2021-12-29 — End: 2022-01-13
  Filled 2021-12-29 – 2022-01-08 (×2): qty 354, 1d supply, fill #0

## 2021-12-29 NOTE — Progress Notes (Signed)

## 2022-01-06 ENCOUNTER — Other Ambulatory Visit (HOSPITAL_COMMUNITY): Payer: Self-pay

## 2022-01-07 ENCOUNTER — Encounter: Payer: Self-pay | Admitting: Gastroenterology

## 2022-01-08 ENCOUNTER — Other Ambulatory Visit (HOSPITAL_COMMUNITY): Payer: Self-pay

## 2022-01-12 ENCOUNTER — Other Ambulatory Visit (HOSPITAL_COMMUNITY): Payer: Self-pay

## 2022-01-13 ENCOUNTER — Encounter: Payer: Self-pay | Admitting: Gastroenterology

## 2022-01-13 ENCOUNTER — Ambulatory Visit (AMBULATORY_SURGERY_CENTER): Payer: 59 | Admitting: Gastroenterology

## 2022-01-13 ENCOUNTER — Other Ambulatory Visit: Payer: Self-pay

## 2022-01-13 VITALS — BP 112/68 | HR 60 | Temp 97.8°F | Resp 16 | Ht 69.0 in | Wt 240.0 lb

## 2022-01-13 DIAGNOSIS — D128 Benign neoplasm of rectum: Secondary | ICD-10-CM

## 2022-01-13 DIAGNOSIS — Z86010 Personal history of colonic polyps: Secondary | ICD-10-CM | POA: Diagnosis not present

## 2022-01-13 DIAGNOSIS — D125 Benign neoplasm of sigmoid colon: Secondary | ICD-10-CM | POA: Diagnosis not present

## 2022-01-13 DIAGNOSIS — D127 Benign neoplasm of rectosigmoid junction: Secondary | ICD-10-CM | POA: Diagnosis not present

## 2022-01-13 DIAGNOSIS — Z1211 Encounter for screening for malignant neoplasm of colon: Secondary | ICD-10-CM | POA: Diagnosis not present

## 2022-01-13 MED ORDER — SODIUM CHLORIDE 0.9 % IV SOLN: 500.0000 mL | INTRAVENOUS | Status: DC

## 2022-01-13 NOTE — Patient Instructions (Signed)
YOU HAD AN ENDOSCOPIC PROCEDURE TODAY AT [facility]:   Refer to the procedure report that was given to you for any specific questions about what was found during the examination.  If the procedure report does not answer your questions, please call your gastroenterologist to clarify.  If you requested that your care partner not be given the details of your procedure findings, then the procedure report has been included in a sealed envelope for you to review at your convenience later. ? ?**Handouts given on Polyps, diverticulosis and hemorrhoids** ? ?YOU SHOULD EXPECT: Some feelings of bloating in the abdomen. Passage of more gas than usual.  Walking can help get rid of the air that was put into your GI tract during the procedure and reduce the bloating. If you had a lower endoscopy (such as a colonoscopy or flexible sigmoidoscopy) you may notice spotting of blood in your stool or on the toilet paper. If you underwent a bowel prep for your procedure, you may not have a normal bowel movement for a few days. ? ?Please Note:  You might notice some irritation and congestion in your nose or some drainage.  This is from the oxygen used during your procedure.  There is no need for concern and it should clear up in a day or so. ? ?SYMPTOMS TO REPORT IMMEDIATELY: ? ?Following lower endoscopy (colonoscopy or flexible sigmoidoscopy): ? Excessive amounts of blood in the stool ? Significant tenderness or worsening of abdominal pains ? Swelling of the abdomen that is new, acute ? Fever of 100?F or higher ? ?For urgent or emergent issues, a gastroenterologist can be reached at any hour by calling [phone]. ?Do not use MyChart messaging for urgent concerns.  ? ? ?DIET:  We do recommend a small meal at first, but then you may proceed to your regular diet.  Drink plenty of fluids but you should avoid alcoholic beverages for 24 hours. ? ?ACTIVITY:  You should plan to take it easy for the rest of today and you  should NOT DRIVE or use heavy machinery until tomorrow (because of the sedation medicines used during the test).   ? ?FOLLOW UP: ?Our staff will call the number listed on your records 48-72 hours following your procedure to check on you and address any questions or concerns that you may have regarding the information given to you following your procedure. If we do not reach you, we will leave a message.  We will attempt to reach you two times.  During this call, we will ask if you have developed any symptoms of COVID 19. If you develop any symptoms (ie: fever, flu-like symptoms, shortness of breath, cough etc.) before then, please call [phone].  If you test positive for Covid 19 in the 2 weeks post procedure, please call and report this information to [place].   ? ?If any biopsies were taken you will be contacted by phone or by letter within the next 1-3 weeks.  Please call us at [phone] if you have not heard about the biopsies in 3 weeks.  ? ? ?SIGNATURES/CONFIDENTIALITY: ?You and/or your care partner have signed paperwork which will be entered into your electronic medical record.  These signatures attest to the fact that that the information above on your After Visit Summary has been reviewed and is understood.  Full responsibility of the confidentiality of this discharge information lies with you and/or your care-partner.  ?

## 2022-01-13 NOTE — Progress Notes (Signed)
Pt's states no medical or surgical changes since previsit or office visit. 

## 2022-01-13 NOTE — Progress Notes (Signed)
To Pacu, VSS. Report to RN.tb 

## 2022-01-13 NOTE — Progress Notes (Signed)
[facility] History and Physical ? ? ?Primary Care Physician:  Practice, [facility] ? ? ?Reason for Procedure:   H/O polyps  ? ?Plan:    Colon ? ? ? ? ?HPI: [name] is a 70 y.o. male  ? ? ?Past Medical History:  ?Diagnosis Date  ? GERD (gastroesophageal reflux disease)   ? History of adenomatous polyp of colon   ? 2004  hyperplastic and tubular adenoma/  2012 hyperplastic  ? History of aspiration pneumonitis   ? 03/ 2015  ? HSV-1 (herpes simplex virus 1) infection   ? chronic fever blister's  ? Hyperlipidemia   ? Phimosis   ? Snores   ? wears snore night gaurd /  per pt no sleep study  ? Wears glasses   ? ? ?Past Surgical History:  ?Procedure Laterality Date  ? ARTHROSCOPIC REPAIR ACL Left 1992  ? CIRCUMCISION N/A 08/06/2016  ? Procedure: CIRCUMCISION ADULT;  Surgeon: Ardis Hughs, MD;  Location: [facility];  Service: Urology;  Laterality: N/A;  ? COLONOSCOPY  last one 05-29-2011  ? EAR CYST EXCISION  08/19/2012  ? Procedure: CYST REMOVAL;  Surgeon: Jerrell Belfast, MD;  Location: [facility];  Service: ENT;  Laterality: Left;  excision of left  infra auricular cyst   ? INGUINAL HERNIA REPAIR Right 1980's  ? KNEE ARTHROSCOPY Left 04/22/2006  ? ? ?Prior to Admission medications   ?Medication Sig Start Date End Date Taking? Authorizing Provider  ?famciclovir (FAMVIR) 250 MG tablet Take 1 tablet (250 mg total) by mouth 2 (two) times daily for suppression 08/28/21  Yes   ?simvastatin (ZOCOR) 40 MG tablet Take 1 tablet (40 mg total) by mouth daily. 06/24/21  Yes   ?Testosterone 20.25 MG/ACT (1.62%) GEL Apply 2 pumps topically daily. 08/28/21  Yes   ? ? ?Current Outpatient Medications  ?Medication Sig Dispense Refill  ? famciclovir (FAMVIR) 250 MG tablet Take 1 tablet (250 mg total) by mouth 2 (two) times daily for suppression 180 tablet 3  ? simvastatin (ZOCOR) 40 MG tablet Take 1 tablet (40 mg total) by mouth daily. 90 tablet 1  ? Testosterone 20.25  MG/ACT (1.62%) GEL Apply 2 pumps topically daily. 75 g 5  ? ?Current Facility-Administered Medications  ?Medication Dose Route Frequency Provider Last Rate Last Admin  ? 0.9 %  sodium chloride infusion  500 mL Intravenous Continuous Jackquline Denmark, MD      ? ? ?Allergies as of 01/13/2022 - Review Complete 01/13/2022  ?Allergen Reaction Noted  ? Codeine Hives 01/27/2012  ? ? ?Family History  ?Problem Relation Age of Onset  ? Colon cancer Neg Hx   ? Colon polyps Neg Hx   ? ? ?Social History  ? ?Socioeconomic History  ? Marital status: Married  ?  Spouse name: Not on file  ? Number of children: Not on file  ? Years of education: Not on file  ? Highest education level: Not on file  ?Occupational History  ? Not on file  ?Tobacco Use  ? Smoking status: Never  ? Smokeless tobacco: Never  ?Vaping Use  ? Vaping Use: Never used  ?Substance and Sexual Activity  ? Alcohol use: No  ? Drug use: No  ? Sexual activity: Yes  ?Other Topics Concern  ? Not on file  ?Social History Narrative  ? Not on file  ? ?Social Determinants of Health  ? ?Financial Resource Strain: Not on file  ?Food Insecurity: Not on file  ?Transportation  Needs: Not on file  ?Physical Activity: Not on file  ?Stress: Not on file  ?Social Connections: Not on file  ?Intimate Partner Violence: Not on file  ? ? ?Review of Systems: ?Positive for none ?All other review of systems negative except as mentioned in the HPI. ? ?Physical Exam: ?Vital signs in last 24 hours: ?'@VSRANGES'$ @ ?  ?General:   Alert,  Well-developed, well-nourished, pleasant and cooperative in NAD ?Lungs:  Clear throughout to auscultation.   ?Heart:  Regular rate and rhythm; no murmurs, clicks, rubs,  or gallops. ?Abdomen:  Soft, nontender and nondistended. Normal bowel sounds.   ?Neuro/Psych:  Alert and cooperative. Normal mood and affect. A and O x 3 ? ? ? ?No significant changes were identified.  The patient continues to be an appropriate candidate for the planned procedure and anesthesia. ? ? ?Carmell Austria, MD. ?[facility] ?01/13/2022 8:06 AM@ ? ?

## 2022-01-13 NOTE — Op Note (Signed)
[facility] ?Patient Name: [name] ?Procedure Date: 01/13/2022 8:00 AM ?MRN: [id number] ?Endoscopist: Jackquline Denmark , MD ?Age: 71 ?Referring MD:  ?Date of Birth: 1951/02/01 ?Gender: Male ?Account #: [id number] ?Procedure:                Colonoscopy ?Indications:              High risk colon cancer surveillance: Personal  ?                          history of colonic polyps ?Medicines:                Monitored Anesthesia Care ?Procedure:                Pre-Anesthesia Assessment: ?                          - Prior to the procedure, a History and Physical  ?                          was performed, and patient medications and  ?                          allergies were reviewed. The patient's tolerance of  ?                          previous anesthesia was also reviewed. The risks  ?                          and benefits of the procedure and the sedation  ?                          options and risks were discussed with the patient.  ?                          All questions were answered, and informed consent  ?                          was obtained. Prior Anticoagulants: The patient has  ?                          taken no previous anticoagulant or antiplatelet  ?                          agents. ASA Grade Assessment: II - A patient with  ?                          mild systemic disease. After reviewing the risks  ?                          and benefits, the patient was deemed in  ?                          satisfactory condition to undergo the procedure. ?  After obtaining informed consent, the colonoscope  ?                          was passed under direct vision. Throughout the  ?                          procedure, the patient's blood pressure, pulse, and  ?                          oxygen saturations were monitored continuously. The  ?                          Olympus CF-HQ190L ([id number]) Colonoscope was  ?                          introduced through the anus and advanced to the  the  ?                          cecum, identified by appendiceal orifice and  ?                          ileocecal valve. The colonoscopy was performed  ?                          without difficulty. The patient tolerated the  ?                          procedure well. The quality of the bowel  ?                          preparation was adequate to identify polyps. The  ?                          ileocecal valve, appendiceal orifice, and rectum  ?                          were photographed. ?Scope In: 8:09:54 AM ?Scope Out: 8:24:47 AM ?Scope Withdrawal Time: 0 hours 10 minutes 58 seconds  ?Total Procedure Duration: 0 hours 14 minutes 53 seconds  ?Findings:                 A 10 mm polyp was found in the proximal sigmoid  ?                          colon. The polyp was semi-pedunculated. The polyp  ?                          was removed with a hot snare. Resection and  ?                          retrieval were complete. ?                          A 4 mm polyp was found in the rectum. The polyp was  ?  sessile. The polyp was removed with a cold snare.  ?                          Resection and retrieval were complete. ?                          There was a medium-sized lipoma, 30 mm in diameter,  ?                          in the mid descending colon. ?                          Multiple medium-mouthed diverticula were found in  ?                          the sigmoid colon. ?                          Non-bleeding internal hemorrhoids were found during  ?                          retroflexion. The hemorrhoids were moderate and  ?                          Grade I (internal hemorrhoids that do not prolapse). ?                          The exam was otherwise without abnormality on  ?                          direct and retroflexion views. ?Complications:            No immediate complications. ?Estimated Blood Loss:     Estimated blood loss: none. ?Impression:               - One 10 mm polyp in the  proximal sigmoid colon,  ?                          removed with a hot snare. Resected and retrieved. ?                          - One 4 mm polyp in the rectum, removed with a cold  ?                          snare. Resected and retrieved. ?                          - Medium-sized lipoma in the mid descending colon. ?                          - Moderate sigmoid diverticulosis. ?                          - Non-bleeding internal hemorrhoids. ?                          -  The examination was otherwise normal on direct  ?                          and retroflexion views. ?Recommendation:           - Patient has a contact number available for  ?                          emergencies. The signs and symptoms of potential  ?                          delayed complications were discussed with the  ?                          patient. Return to normal activities tomorrow.  ?                          Written discharge instructions were provided to the  ?                          patient. ?                          - High fiber diet. ?                          - Continue present medications. ?                          - Await pathology results. ?                          - Repeat colonoscopy for surveillance based on  ?                          pathology results. ?                          - The findings and recommendations were discussed  ?                          with the patient's family. ?Jackquline Denmark, MD ?01/13/2022 8:30:24 AM ?This report has been signed electronically. ?

## 2022-01-15 ENCOUNTER — Telehealth: Payer: Self-pay

## 2022-01-15 NOTE — Telephone Encounter (Signed)
?  Follow up Call- ? ?Call back number 01/13/2022  ?Post procedure Call Back phone  # [phone]  ?Permission to leave phone message Yes  ?Some recent data might be hidden  ?  ? ?Patient questions: ? ?Do you have a fever, pain , or abdominal swelling? No. ?Pain Score  0 * ? ?Have you tolerated food without any problems? Yes.   ? ?Have you been able to return to your normal activities? Yes.   ? ?Do you have any questions about your discharge instructions: ?Diet   No. ?Medications  No. ?Follow up visit  No. ? ?Do you have questions or concerns about your Care? No. ? ?Actions: ?* If pain score is 4 or above: ?No action needed, pain <4. ? ? ?

## 2022-01-25 ENCOUNTER — Encounter: Payer: Self-pay | Admitting: Gastroenterology

## 2022-02-23 ENCOUNTER — Other Ambulatory Visit (HOSPITAL_COMMUNITY): Payer: Self-pay

## 2022-02-24 DIAGNOSIS — Z79899 Other long term (current) drug therapy: Secondary | ICD-10-CM | POA: Diagnosis not present

## 2022-02-24 DIAGNOSIS — E349 Endocrine disorder, unspecified: Secondary | ICD-10-CM | POA: Diagnosis not present

## 2022-02-24 DIAGNOSIS — E7800 Pure hypercholesterolemia, unspecified: Secondary | ICD-10-CM | POA: Diagnosis not present

## 2022-02-24 DIAGNOSIS — Z20822 Contact with and (suspected) exposure to covid-19: Secondary | ICD-10-CM | POA: Diagnosis not present

## 2022-02-27 ENCOUNTER — Other Ambulatory Visit (HOSPITAL_COMMUNITY): Payer: Self-pay

## 2022-02-27 DIAGNOSIS — Z139 Encounter for screening, unspecified: Secondary | ICD-10-CM | POA: Diagnosis not present

## 2022-02-27 DIAGNOSIS — Z6835 Body mass index (BMI) 35.0-35.9, adult: Secondary | ICD-10-CM | POA: Diagnosis not present

## 2022-02-27 DIAGNOSIS — E349 Endocrine disorder, unspecified: Secondary | ICD-10-CM | POA: Diagnosis not present

## 2022-02-27 DIAGNOSIS — Z1331 Encounter for screening for depression: Secondary | ICD-10-CM | POA: Diagnosis not present

## 2022-02-27 DIAGNOSIS — A6000 Herpesviral infection of urogenital system, unspecified: Secondary | ICD-10-CM | POA: Diagnosis not present

## 2022-02-27 DIAGNOSIS — M170 Bilateral primary osteoarthritis of knee: Secondary | ICD-10-CM | POA: Diagnosis not present

## 2022-02-27 DIAGNOSIS — E7800 Pure hypercholesterolemia, unspecified: Secondary | ICD-10-CM | POA: Diagnosis not present

## 2022-02-27 DIAGNOSIS — Z9181 History of falling: Secondary | ICD-10-CM | POA: Diagnosis not present

## 2022-02-27 MED ORDER — TESTOSTERONE 20.25 MG/ACT (1.62%) TD GEL
2.0000 | Freq: Every day | TRANSDERMAL | 5 refills | Status: DC
  Filled 2022-02-27 – 2022-03-30 (×2): qty 75, 30d supply, fill #0
  Filled 2022-05-10: qty 75, 30d supply, fill #1
  Filled 2022-06-11: qty 75, 30d supply, fill #2

## 2022-02-27 MED ORDER — FAMCICLOVIR 250 MG PO TABS
250.0000 mg | ORAL_TABLET | Freq: Two times a day (BID) | ORAL | 3 refills | Status: DC
  Filled 2022-02-27 – 2022-07-01 (×2): qty 180, 90d supply, fill #0
  Filled 2022-11-08: qty 180, 90d supply, fill #1
  Filled 2023-02-04: qty 180, 90d supply, fill #2

## 2022-03-09 ENCOUNTER — Other Ambulatory Visit (HOSPITAL_COMMUNITY): Payer: Self-pay

## 2022-03-27 DIAGNOSIS — H524 Presbyopia: Secondary | ICD-10-CM | POA: Diagnosis not present

## 2022-03-27 DIAGNOSIS — H2513 Age-related nuclear cataract, bilateral: Secondary | ICD-10-CM | POA: Diagnosis not present

## 2022-03-27 DIAGNOSIS — H52223 Regular astigmatism, bilateral: Secondary | ICD-10-CM | POA: Diagnosis not present

## 2022-03-31 ENCOUNTER — Other Ambulatory Visit (HOSPITAL_COMMUNITY): Payer: Self-pay

## 2022-03-31 DIAGNOSIS — M5416 Radiculopathy, lumbar region: Secondary | ICD-10-CM | POA: Diagnosis not present

## 2022-03-31 MED ORDER — METHOCARBAMOL 500 MG PO TABS
500.0000 mg | ORAL_TABLET | Freq: Four times a day (QID) | ORAL | 4 refills | Status: DC | PRN
  Filled 2022-03-31: qty 30, 8d supply, fill #0

## 2022-03-31 MED ORDER — PREDNISONE 10 MG (48) PO TBPK
ORAL_TABLET | ORAL | 0 refills | Status: DC
  Filled 2022-03-31: qty 48, 12d supply, fill #0

## 2022-04-02 DIAGNOSIS — M545 Low back pain, unspecified: Secondary | ICD-10-CM | POA: Diagnosis not present

## 2022-04-02 DIAGNOSIS — M5416 Radiculopathy, lumbar region: Secondary | ICD-10-CM | POA: Diagnosis not present

## 2022-04-03 DIAGNOSIS — M5416 Radiculopathy, lumbar region: Secondary | ICD-10-CM | POA: Diagnosis not present

## 2022-05-08 DIAGNOSIS — M5116 Intervertebral disc disorders with radiculopathy, lumbar region: Secondary | ICD-10-CM | POA: Diagnosis not present

## 2022-05-08 DIAGNOSIS — M5416 Radiculopathy, lumbar region: Secondary | ICD-10-CM | POA: Diagnosis not present

## 2022-05-11 ENCOUNTER — Other Ambulatory Visit (HOSPITAL_COMMUNITY): Payer: Self-pay

## 2022-06-10 ENCOUNTER — Telehealth: Payer: Self-pay | Admitting: Orthopedic Surgery

## 2022-06-10 NOTE — Telephone Encounter (Signed)
Left message on patient's voice mail.  Patient needs to make an appointment with Dr. Marlou Sa prior to left total knee surgery and have repeat xrays.  Current x-rays are from 2019 and 2020.  Surgery is schedule 08-04-22.

## 2022-06-12 ENCOUNTER — Other Ambulatory Visit (HOSPITAL_COMMUNITY): Payer: Self-pay

## 2022-06-29 ENCOUNTER — Other Ambulatory Visit: Payer: Self-pay

## 2022-07-01 ENCOUNTER — Encounter: Payer: Self-pay | Admitting: Orthopedic Surgery

## 2022-07-01 ENCOUNTER — Other Ambulatory Visit (HOSPITAL_COMMUNITY): Payer: Self-pay

## 2022-07-01 ENCOUNTER — Ambulatory Visit: Payer: 59 | Admitting: Orthopedic Surgery

## 2022-07-01 ENCOUNTER — Ambulatory Visit (INDEPENDENT_AMBULATORY_CARE_PROVIDER_SITE_OTHER): Payer: 59

## 2022-07-01 DIAGNOSIS — M1712 Unilateral primary osteoarthritis, left knee: Secondary | ICD-10-CM | POA: Diagnosis not present

## 2022-07-01 NOTE — Progress Notes (Signed)
Office Visit Note   Patient: [name]           Date of Birth: Jun 07, 1951           MRN: [id number] Visit Date: 07/01/2022 Requested by: Practice, [place],  [place] [id number] PCP: [facility], [facility]  Subjective: Chief Complaint  Patient presents with   Left Knee - Pain    HPI: Kevan is a 71 year old patient with left knee pain.  He has had left knee pain and arthritis for many years.  Has had prior ACL reconstruction on the left knee.  Has pain which interferes with his activities of daily living.  He has been losing weight and doing a lot of fitness training.  Recently had back ESI which helped a lot of his back pain and to some degree of his leg pain.  Does report decreased walking endurance.  Denies any instability in the knee.  Did have medial meniscectomy at the time of his ACL reconstruction 25 years ago.              ROS: All systems reviewed are negative as they relate to the chief complaint within the history of present illness.  Patient denies  fevers or chills.   Assessment & Plan: Visit Diagnoses:  1. Unilateral primary osteoarthritis, left knee     Plan: Impression is left knee pain.  End-stage arthritis is present.  He has failed conservative management including multiple injections as well as rehab stretching and activity modification.  No personal or family history of DVT or pulmonary embolism.  Had his teeth cleaned recently and no tooth issues present.  Plan at this time is left knee replacement.  Plan for press-fit prosthesis depending on bone quality.  Risk and benefits are discussed including not limited to infection or vessel damage knee stiffness as well as ongoing rehab process involved.  Anticipate using CPM machine for the first 2 to 3 weeks after surgery with home health physical therapy early on transitioning outpatient physical therapy after his first postop visit.  Overnight stay in the hospital.   All questions answered  Follow-Up Instructions: No follow-ups on file.   Orders:  Orders Placed This Encounter  Procedures   XR Knee 1-2 Views Left   No orders of the defined types were placed in this encounter.     Procedures: No procedures performed   Clinical Data: No additional findings.  Objective: Vital Signs: There were no vitals taken for this visit.  Physical Exam:   Constitutional: Patient appears well-developed HEENT:  Head: Normocephalic Eyes:EOM are normal Neck: Normal range of motion Cardiovascular: Normal rate Pulmonary/chest: Effort normal Neurologic: Patient is alert Skin: Skin is warm Psychiatric: Patient has normal mood and affect   Ortho Exam: Ortho exam demonstrates range of motion 0-1 20.  Collaterals are stable.  ACL intact.  No effusion.  Neck dorsiflexion intact on the left with palpable pedal pulses.  Well-healed surgical incision from bone patella tendon bone ACL reconstruction noted.  Hardware removal will be required on the tibial side with bone grafting of that tunnel prior to tibial instrumentation.  Specialty Comments:  No specialty comments available.  Imaging: XR Knee 1-2 Views Left  Result Date: 07/01/2022 AP lateral radiographs left knee reviewed.  End-stage tricompartmental arthritis is present worse in the medial and patellofemoral compartments.  Slight varus alignment present.  2 metal ACL screws noted.  No acute fracture    PMFS History: Patient Active  Problem List   Diagnosis Date Noted   Infected cyst of skin 08/19/2012    Class: Chronic   Past Medical History:  Diagnosis Date   GERD (gastroesophageal reflux disease)    History of adenomatous polyp of colon    2004  hyperplastic and tubular adenoma/  2012 hyperplastic   History of aspiration pneumonitis    03/ 2015   HSV-1 (herpes simplex virus 1) infection    chronic fever blister's   Hyperlipidemia    Phimosis    Snores    wears snore night gaurd /  per pt  no sleep study   Wears glasses     Family History  Problem Relation Age of Onset   Colon cancer Neg Hx    Colon polyps Neg Hx     Past Surgical History:  Procedure Laterality Date   ARTHROSCOPIC REPAIR ACL Left 1992   CIRCUMCISION N/A 08/06/2016   Procedure: CIRCUMCISION ADULT;  Surgeon: Ardis Hughs, MD;  Location: [facility];  Service: Urology;  Laterality: N/A;   COLONOSCOPY  last one 05-29-2011   EAR CYST EXCISION  08/19/2012   Procedure: CYST REMOVAL;  Surgeon: Jerrell Belfast, MD;  Location: [facility];  Service: ENT;  Laterality: Left;  excision of left  infra auricular cyst    INGUINAL HERNIA REPAIR Right 1980's   KNEE ARTHROSCOPY Left 04/22/2006   Social History   Occupational History   Not on file  Tobacco Use   Smoking status: Never   Smokeless tobacco: Never  Vaping Use   Vaping Use: Never used  Substance and Sexual Activity   Alcohol use: No   Drug use: No   Sexual activity: Yes

## 2022-07-03 ENCOUNTER — Telehealth: Payer: Self-pay | Admitting: Orthopedic Surgery

## 2022-07-03 NOTE — Telephone Encounter (Signed)
Matrix fors received. To [facility].

## 2022-07-22 DIAGNOSIS — Z20822 Contact with and (suspected) exposure to covid-19: Secondary | ICD-10-CM | POA: Diagnosis not present

## 2022-07-22 DIAGNOSIS — E7800 Pure hypercholesterolemia, unspecified: Secondary | ICD-10-CM | POA: Diagnosis not present

## 2022-07-22 DIAGNOSIS — Z79899 Other long term (current) drug therapy: Secondary | ICD-10-CM | POA: Diagnosis not present

## 2022-07-22 DIAGNOSIS — Z125 Encounter for screening for malignant neoplasm of prostate: Secondary | ICD-10-CM | POA: Diagnosis not present

## 2022-07-22 DIAGNOSIS — Z01818 Encounter for other preprocedural examination: Secondary | ICD-10-CM | POA: Diagnosis not present

## 2022-07-22 DIAGNOSIS — M170 Bilateral primary osteoarthritis of knee: Secondary | ICD-10-CM | POA: Diagnosis not present

## 2022-07-22 DIAGNOSIS — E349 Endocrine disorder, unspecified: Secondary | ICD-10-CM | POA: Diagnosis not present

## 2022-07-22 DIAGNOSIS — Z131 Encounter for screening for diabetes mellitus: Secondary | ICD-10-CM | POA: Diagnosis not present

## 2022-07-28 ENCOUNTER — Other Ambulatory Visit (HOSPITAL_COMMUNITY): Payer: 59

## 2022-07-30 NOTE — Progress Notes (Signed)
Surgical Instructions    Your procedure is scheduled on 08/04/22.  Report to [facility] Main Entrance "A" at 5:30 A.M., then check in with the Admitting office.  Call this number if you have problems the morning of surgery:  [phone]   If you have any questions prior to your surgery date call [phone]: Open Monday-Friday 8am-4pm If you experience any cold or flu symptoms such as cough, fever, chills, shortness of breath, etc. between now and your scheduled surgery, please notify us at the above number     Remember:  Do not eat after midnight the night before your surgery  You may drink clear liquids until 4:30am the morning of your surgery.   Clear liquids allowed are: Water, Non-Citrus Juices (without pulp), Carbonated Beverages, Clear Tea, Black Coffee ONLY (NO MILK, CREAM OR POWDERED CREAMER of any kind), and Gatorade  Patient Instructions  The night before surgery:  No food after midnight. ONLY clear liquids after midnight  The day of surgery (if you do NOT have diabetes):  Drink ONE (1) Pre-Surgery Clear Ensure by 4:30am the morning of surgery. Drink in one sitting. Do not sip.  This drink was given to you during your hospital  pre-op appointment visit. Nothing else to drink after completing the  Pre-Surgery Clear Ensure.          If you have questions, please contact your surgeon's office.     Take these medicines the morning of surgery with A SIP OF WATER:  famciclovir (FAMVIR)   IF NEEDED: acetaminophen (TYLENOL)  As of today, STOP taking any Aspirin (unless otherwise instructed by your surgeon) Aleve, Naproxen, Ibuprofen, Motrin, Advil, Goody's, BC's, all herbal medications, fish oil, and all vitamins.           Do not wear jewelry or makeup. Do not wear lotions, powders, cologne or deodorant. Men may shave face and neck. Do not bring valuables to the hospital. Do not wear nail polish, gel polish, artificial nails, or any other type of covering on  natural nails (fingers and toes) If you have artificial nails or gel coating that need to be removed by a nail salon, please have this removed prior to surgery. Artificial nails or gel coating may interfere with anesthesia's ability to adequately monitor your vital signs.  [facility] is not responsible for any belongings or valuables.    Do NOT Smoke (Tobacco/Vaping)  24 hours prior to your procedure  If you use a CPAP at night, you may bring your mask for your overnight stay.   Contacts, glasses, hearing aids, dentures or partials may not be worn into surgery, please bring cases for these belongings   For patients admitted to the hospital, discharge time will be determined by your treatment team.   Patients discharged the day of surgery will not be allowed to drive home, and someone needs to stay with them for 24 hours.   SURGICAL WAITING ROOM VISITATION Patients having surgery or a procedure may have no more than 2 support people in the waiting area - these visitors may rotate.   Children under the age of 58 must have an adult with them who is not the patient. If the patient needs to stay at the hospital during part of their recovery, the visitor guidelines for inpatient rooms apply. Pre-op nurse will coordinate an appropriate time for 1 support person to accompany patient in pre-op.  This support person may not rotate.   Please refer to the [facility] website for the  visitor guidelines for Inpatients (after your surgery is over and you are in a regular room).    Special instructions:    Oral Hygiene is also important to reduce your risk of infection.  Remember - BRUSH YOUR TEETH THE MORNING OF SURGERY WITH YOUR REGULAR TOOTHPASTE   [facility]- Preparing For Surgery  Before surgery, you can play an important role. Because skin is not sterile, your skin needs to be as free of germs as possible. You can reduce the number of germs on your skin by washing with CHG (chlorahexidine  gluconate) Soap before surgery.  CHG is an antiseptic cleaner which kills germs and bonds with the skin to continue killing germs even after washing.     Please do not use if you have an allergy to CHG or antibacterial soaps. If your skin becomes reddened/irritated stop using the CHG.  Do not shave (including legs and underarms) for at least 48 hours prior to first CHG shower. It is OK to shave your face.  Please follow these instructions carefully.     Shower the NIGHT BEFORE SURGERY and the MORNING OF SURGERY with CHG Soap.   If you chose to wash your hair, wash your hair first as usual with your normal shampoo. After you shampoo, rinse your hair and body thoroughly to remove the shampoo.  Then [employer] and genitals (private parts) with your normal soap and rinse thoroughly to remove soap.  After that Use CHG Soap as you would any other liquid soap. You can apply CHG directly to the skin and wash gently with a scrungie or a clean washcloth.   Apply the CHG Soap to your body ONLY FROM THE NECK DOWN.  Do not use on open wounds or open sores. Avoid contact with your eyes, ears, mouth and genitals (private parts). Wash Face and genitals (private parts)  with your normal soap.   Wash thoroughly, paying special attention to the area where your surgery will be performed.  Thoroughly rinse your body with warm water from the neck down.  DO NOT shower/wash with your normal soap after using and rinsing off the CHG Soap.  Pat yourself dry with a CLEAN TOWEL.  Wear CLEAN PAJAMAS to bed the night before surgery  Place CLEAN SHEETS on your bed the night before your surgery  DO NOT SLEEP WITH PETS.   Day of Surgery: Take a shower with CHG soap. Wear Clean/Comfortable clothing the morning of surgery Do not apply any deodorants/lotions.   Remember to brush your teeth WITH YOUR REGULAR TOOTHPASTE.    If you received a COVID test during your pre-op visit, it is requested that you wear a mask  when out in public, stay away from anyone that may not be feeling well, and notify your surgeon if you develop symptoms. If you have been in contact with anyone that has tested positive in the last 10 days, please notify your surgeon.    Please read over the following fact sheets that you were given.

## 2022-07-31 ENCOUNTER — Encounter (HOSPITAL_COMMUNITY): Payer: Self-pay | Admitting: Orthopedic Surgery

## 2022-07-31 NOTE — Progress Notes (Signed)
Rocky Morel, RN reviewed and printed Instructions for DOS with the patient.   PCP - Dr Daiva Eves Cardiologist - n/a  Chest x-ray - n/a EKG - n/a Stress Test - n/a ECHO - n/a Cardiac Cath - n/a  ICD Pacemaker/Loop - n/a  Sleep Study -  n/a CPAP - none  ERAS: Clear liquids til 4:30 AM DOS.  STOP now taking any Aspirin (unless otherwise instructed by your surgeon), Aleve, Naproxen, Ibuprofen, Motrin, Advil, Goody's, BC's, all herbal medications, fish oil, and all vitamins.

## 2022-08-03 NOTE — Anesthesia Preprocedure Evaluation (Addendum)
Anesthesia Evaluation  Patient identified by MRN, date of birth, ID band Patient awake    Reviewed: Allergy & Precautions, H&P , NPO status , Patient's Chart, lab work & pertinent test results  Airway Mallampati: II  TM Distance: >3 FB Neck ROM: Full    Dental no notable dental hx. (+) Teeth Intact, Dental Advisory Given   Pulmonary neg pulmonary ROS,    Pulmonary exam normal breath sounds clear to auscultation       Cardiovascular Exercise Tolerance: Good negative cardio ROS   Rhythm:Regular Rate:Normal     Neuro/Psych negative neurological ROS  negative psych ROS   GI/Hepatic Neg liver ROS, GERD  ,  Endo/Other  negative endocrine ROS  Renal/GU negative Renal ROS  negative genitourinary   Musculoskeletal   Abdominal   Peds  Hematology negative hematology ROS (+)   Anesthesia Other Findings   Reproductive/Obstetrics negative OB ROS                            Anesthesia Physical Anesthesia Plan  ASA: 2  Anesthesia Plan: Spinal   Post-op Pain Management: Regional block* and Tylenol PO (pre-op)*   Induction: Intravenous  PONV Risk Score and Plan: 2 and Propofol infusion and Ondansetron  Airway Management Planned: Natural Airway and Simple Face Mask  Additional Equipment:   Intra-op Plan:   Post-operative Plan:   Informed Consent: I have reviewed the patients History and Physical, chart, labs and discussed the procedure including the risks, benefits and alternatives for the proposed anesthesia with the patient or authorized representative who has indicated his/her understanding and acceptance.     Dental advisory given  Plan Discussed with: CRNA  Anesthesia Plan Comments:        Anesthesia Quick Evaluation

## 2022-08-04 ENCOUNTER — Other Ambulatory Visit: Payer: Self-pay

## 2022-08-04 ENCOUNTER — Encounter (HOSPITAL_COMMUNITY): Payer: Self-pay | Admitting: Orthopedic Surgery

## 2022-08-04 ENCOUNTER — Ambulatory Visit (HOSPITAL_COMMUNITY): Payer: 59 | Admitting: Anesthesiology

## 2022-08-04 ENCOUNTER — Ambulatory Visit (HOSPITAL_BASED_OUTPATIENT_CLINIC_OR_DEPARTMENT_OTHER): Payer: 59 | Admitting: Anesthesiology

## 2022-08-04 ENCOUNTER — Observation Stay (HOSPITAL_COMMUNITY): Payer: 59

## 2022-08-04 ENCOUNTER — Observation Stay (HOSPITAL_COMMUNITY)
Admission: RE | Admit: 2022-08-04 | Discharge: 2022-08-05 | Disposition: A | Discharge status: Home Health | Payer: 59 | Attending: Orthopedic Surgery | Admitting: Orthopedic Surgery

## 2022-08-04 ENCOUNTER — Encounter (HOSPITAL_COMMUNITY)
Admission: RE | Disposition: A | Discharge status: Home Health | Payer: Self-pay | Source: Home / Self Care | Attending: Orthopedic Surgery

## 2022-08-04 DIAGNOSIS — Z96659 Presence of unspecified artificial knee joint: Secondary | ICD-10-CM

## 2022-08-04 DIAGNOSIS — T8484XA Pain due to internal orthopedic prosthetic devices, implants and grafts, initial encounter: Secondary | ICD-10-CM | POA: Insufficient documentation

## 2022-08-04 DIAGNOSIS — M179 Osteoarthritis of knee, unspecified: Secondary | ICD-10-CM | POA: Diagnosis present

## 2022-08-04 DIAGNOSIS — Z472 Encounter for removal of internal fixation device: Secondary | ICD-10-CM

## 2022-08-04 DIAGNOSIS — M1712 Unilateral primary osteoarthritis, left knee: Secondary | ICD-10-CM

## 2022-08-04 DIAGNOSIS — G8918 Other acute postprocedural pain: Secondary | ICD-10-CM | POA: Diagnosis not present

## 2022-08-04 DIAGNOSIS — Z96652 Presence of left artificial knee joint: Secondary | ICD-10-CM | POA: Diagnosis not present

## 2022-08-04 DIAGNOSIS — Z969 Presence of functional implant, unspecified: Secondary | ICD-10-CM | POA: Diagnosis not present

## 2022-08-04 HISTORY — PX: TOTAL KNEE ARTHROPLASTY: SHX125

## 2022-08-04 LAB — SURGICAL PCR SCREEN
MRSA, PCR: NEGATIVE
Staphylococcus aureus: NEGATIVE

## 2022-08-04 SURGERY — ARTHROPLASTY, KNEE, TOTAL
Anesthesia: Spinal | Site: Knee | Laterality: Left

## 2022-08-04 MED ORDER — ACETAMINOPHEN 10 MG/ML IV SOLN
INTRAVENOUS | Status: AC
  Filled 2022-08-04: qty 100

## 2022-08-04 MED ORDER — CEFAZOLIN SODIUM-DEXTROSE 2-4 GM/100ML-% IV SOLN
2.0000 g | INTRAVENOUS | Status: AC
  Administered 2022-08-04: 2 g via INTRAVENOUS

## 2022-08-04 MED ORDER — METHOCARBAMOL 500 MG PO TABS
500.0000 mg | ORAL_TABLET | Freq: Four times a day (QID) | ORAL | Status: DC | PRN
  Administered 2022-08-04 – 2022-08-05 (×2): 500 mg via ORAL
  Filled 2022-08-04 (×2): qty 1

## 2022-08-04 MED ORDER — BUPIVACAINE IN DEXTROSE 0.75-8.25 % IT SOLN
INTRATHECAL | Status: DC | PRN
  Administered 2022-08-04: 2 mL via INTRATHECAL

## 2022-08-04 MED ORDER — FENTANYL CITRATE (PF) 100 MCG/2ML IJ SOLN
INTRAMUSCULAR | Status: AC
  Filled 2022-08-04: qty 2

## 2022-08-04 MED ORDER — ACETAMINOPHEN 325 MG PO TABS: 325.0000 mg | ORAL_TABLET | Freq: Four times a day (QID) | ORAL | Status: DC | PRN

## 2022-08-04 MED ORDER — ONDANSETRON HCL 4 MG/2ML IJ SOLN: 4.0000 mg | Freq: Four times a day (QID) | INTRAMUSCULAR | Status: DC | PRN

## 2022-08-04 MED ORDER — TRANEXAMIC ACID-NACL 1000-0.7 MG/100ML-% IV SOLN
INTRAVENOUS | Status: AC
  Filled 2022-08-04: qty 100

## 2022-08-04 MED ORDER — ONDANSETRON HCL 4 MG/2ML IJ SOLN
INTRAMUSCULAR | Status: DC | PRN
  Administered 2022-08-04: 4 mg via INTRAVENOUS

## 2022-08-04 MED ORDER — TRANEXAMIC ACID-NACL 1000-0.7 MG/100ML-% IV SOLN
1000.0000 mg | INTRAVENOUS | Status: AC
  Administered 2022-08-04: 1000 mg via INTRAVENOUS

## 2022-08-04 MED ORDER — TRANEXAMIC ACID 1000 MG/10ML IV SOLN
2000.0000 mg | INTRAVENOUS | Status: DC
  Filled 2022-08-04: qty 20

## 2022-08-04 MED ORDER — MIDAZOLAM HCL 5 MG/5ML IJ SOLN
INTRAMUSCULAR | Status: DC | PRN
  Administered 2022-08-04: 2 mg via INTRAVENOUS

## 2022-08-04 MED ORDER — HYDROMORPHONE HCL 1 MG/ML IJ SOLN
0.2500 mg | INTRAMUSCULAR | Status: DC | PRN
  Administered 2022-08-04 (×2): 0.5 mg via INTRAVENOUS

## 2022-08-04 MED ORDER — CELECOXIB 100 MG PO CAPS
100.0000 mg | ORAL_CAPSULE | Freq: Two times a day (BID) | ORAL | Status: DC
  Administered 2022-08-04 – 2022-08-05 (×2): 100 mg via ORAL
  Filled 2022-08-04 (×3): qty 1

## 2022-08-04 MED ORDER — LIDOCAINE 2% (20 MG/ML) 5 ML SYRINGE
INTRAMUSCULAR | Status: DC | PRN
  Administered 2022-08-04: 40 mg via INTRAVENOUS

## 2022-08-04 MED ORDER — LACTATED RINGERS IV SOLN: INTRAVENOUS | Status: DC

## 2022-08-04 MED ORDER — CEFAZOLIN SODIUM-DEXTROSE 2-4 GM/100ML-% IV SOLN
2.0000 g | Freq: Three times a day (TID) | INTRAVENOUS | Status: AC
  Administered 2022-08-04 – 2022-08-05 (×2): 2 g via INTRAVENOUS
  Filled 2022-08-04 (×2): qty 100

## 2022-08-04 MED ORDER — CEFAZOLIN SODIUM-DEXTROSE 2-4 GM/100ML-% IV SOLN
INTRAVENOUS | Status: AC
  Filled 2022-08-04: qty 100

## 2022-08-04 MED ORDER — ASPIRIN 81 MG PO CHEW
81.0000 mg | CHEWABLE_TABLET | Freq: Two times a day (BID) | ORAL | Status: DC
  Administered 2022-08-04 – 2022-08-05 (×2): 81 mg via ORAL
  Filled 2022-08-04 (×2): qty 1

## 2022-08-04 MED ORDER — MORPHINE SULFATE (PF) 4 MG/ML IV SOLN
INTRAVENOUS | Status: AC
  Filled 2022-08-04: qty 2

## 2022-08-04 MED ORDER — BUPIVACAINE LIPOSOME 1.3 % IJ SUSP
INTRAMUSCULAR | Status: AC
  Filled 2022-08-04: qty 20

## 2022-08-04 MED ORDER — PROPOFOL 10 MG/ML IV BOLUS
INTRAVENOUS | Status: DC | PRN
  Administered 2022-08-04 (×2): 20 mg via INTRAVENOUS
  Administered 2022-08-04: 40 mg via INTRAVENOUS

## 2022-08-04 MED ORDER — METOCLOPRAMIDE HCL 5 MG/ML IJ SOLN: 5.0000 mg | Freq: Three times a day (TID) | INTRAMUSCULAR | Status: DC | PRN

## 2022-08-04 MED ORDER — CHLORHEXIDINE GLUCONATE 0.12 % MT SOLN
OROMUCOSAL | Status: AC
  Administered 2022-08-04: 15 mL via OROMUCOSAL
  Filled 2022-08-04: qty 15

## 2022-08-04 MED ORDER — ACETAMINOPHEN 500 MG PO TABS: 1000.0000 mg | ORAL_TABLET | Freq: Once | ORAL | Status: DC

## 2022-08-04 MED ORDER — CHLORHEXIDINE GLUCONATE 0.12 % MT SOLN: 15.0000 mL | Freq: Once | OROMUCOSAL | Status: AC

## 2022-08-04 MED ORDER — MORPHINE SULFATE (PF) 4 MG/ML IV SOLN
INTRAVENOUS | Status: DC | PRN
  Administered 2022-08-04: 8 mg via INTRAVENOUS

## 2022-08-04 MED ORDER — OXYCODONE HCL 5 MG PO TABS
5.0000 mg | ORAL_TABLET | ORAL | Status: DC | PRN
  Administered 2022-08-04 – 2022-08-05 (×4): 10 mg via ORAL
  Filled 2022-08-04 (×4): qty 2

## 2022-08-04 MED ORDER — HYDROMORPHONE HCL 1 MG/ML IJ SOLN: 0.5000 mg | INTRAMUSCULAR | Status: DC | PRN

## 2022-08-04 MED ORDER — HYDROMORPHONE HCL 1 MG/ML IJ SOLN
INTRAMUSCULAR | Status: AC
  Filled 2022-08-04: qty 1

## 2022-08-04 MED ORDER — POVIDONE-IODINE 10 % EX SWAB: 2.0000 | Freq: Once | CUTANEOUS | Status: DC

## 2022-08-04 MED ORDER — POVIDONE-IODINE 7.5 % EX SOLN
Freq: Once | CUTANEOUS | Status: DC
  Filled 2022-08-04: qty 118

## 2022-08-04 MED ORDER — PROPOFOL 10 MG/ML IV BOLUS
INTRAVENOUS | Status: AC
  Filled 2022-08-04: qty 20

## 2022-08-04 MED ORDER — VANCOMYCIN HCL 1000 MG IV SOLR
INTRAVENOUS | Status: DC | PRN
  Administered 2022-08-04: 1000 mg

## 2022-08-04 MED ORDER — ACETAMINOPHEN 500 MG PO TABS
ORAL_TABLET | ORAL | Status: AC
  Filled 2022-08-04: qty 2

## 2022-08-04 MED ORDER — CLONIDINE HCL (ANALGESIA) 100 MCG/ML EP SOLN
EPIDURAL | Status: DC | PRN
  Administered 2022-08-04: 1 mL via INTRA_ARTICULAR

## 2022-08-04 MED ORDER — BUPIVACAINE HCL 0.25 % IJ SOLN
INTRAMUSCULAR | Status: DC | PRN
  Administered 2022-08-04: 10 mL
  Administered 2022-08-04: 20 mL

## 2022-08-04 MED ORDER — BUPIVACAINE HCL (PF) 0.25 % IJ SOLN
INTRAMUSCULAR | Status: AC
  Filled 2022-08-04: qty 30

## 2022-08-04 MED ORDER — METHOCARBAMOL 1000 MG/10ML IJ SOLN: 500.0000 mg | Freq: Four times a day (QID) | INTRAVENOUS | Status: DC | PRN

## 2022-08-04 MED ORDER — METOCLOPRAMIDE HCL 5 MG PO TABS: 5.0000 mg | ORAL_TABLET | Freq: Three times a day (TID) | ORAL | Status: DC | PRN

## 2022-08-04 MED ORDER — ONDANSETRON HCL 4 MG PO TABS: 4.0000 mg | ORAL_TABLET | Freq: Four times a day (QID) | ORAL | Status: DC | PRN

## 2022-08-04 MED ORDER — ACETAMINOPHEN 500 MG PO TABS
1000.0000 mg | ORAL_TABLET | Freq: Four times a day (QID) | ORAL | Status: AC
  Administered 2022-08-04 – 2022-08-05 (×4): 1000 mg via ORAL
  Filled 2022-08-04 (×4): qty 2

## 2022-08-04 MED ORDER — PHENYLEPHRINE HCL-NACL 20-0.9 MG/250ML-% IV SOLN
INTRAVENOUS | Status: DC | PRN
  Administered 2022-08-04: 20 ug/min via INTRAVENOUS

## 2022-08-04 MED ORDER — 0.9 % SODIUM CHLORIDE (POUR BTL) OPTIME
TOPICAL | Status: DC | PRN
  Administered 2022-08-04 (×3): 1000 mL
  Administered 2022-08-04: 2000 mL

## 2022-08-04 MED ORDER — VANCOMYCIN HCL 1000 MG IV SOLR
INTRAVENOUS | Status: AC
  Filled 2022-08-04: qty 20

## 2022-08-04 MED ORDER — MELATONIN 5 MG PO TABS: 10.0000 mg | ORAL_TABLET | Freq: Every evening | ORAL | Status: DC | PRN

## 2022-08-04 MED ORDER — ACETAMINOPHEN 10 MG/ML IV SOLN
1000.0000 mg | Freq: Once | INTRAVENOUS | Status: AC
  Administered 2022-08-04: 1000 mg via INTRAVENOUS

## 2022-08-04 MED ORDER — SODIUM CHLORIDE 0.9 % IV SOLN: INTRAVENOUS | Status: DC

## 2022-08-04 MED ORDER — PHENYLEPHRINE 80 MCG/ML (10ML) SYRINGE FOR IV PUSH (FOR BLOOD PRESSURE SUPPORT)
PREFILLED_SYRINGE | INTRAVENOUS | Status: AC
  Filled 2022-08-04: qty 10

## 2022-08-04 MED ORDER — FENTANYL CITRATE (PF) 100 MCG/2ML IJ SOLN
INTRAMUSCULAR | Status: DC | PRN
  Administered 2022-08-04 (×2): 50 ug via INTRAVENOUS

## 2022-08-04 MED ORDER — PHENOL 1.4 % MT LIQD: 1.0000 | OROMUCOSAL | Status: DC | PRN

## 2022-08-04 MED ORDER — PHENYLEPHRINE 80 MCG/ML (10ML) SYRINGE FOR IV PUSH (FOR BLOOD PRESSURE SUPPORT)
PREFILLED_SYRINGE | INTRAVENOUS | Status: DC | PRN
  Administered 2022-08-04: 80 ug via INTRAVENOUS

## 2022-08-04 MED ORDER — SODIUM CHLORIDE 0.9% FLUSH
INTRAVENOUS | Status: DC | PRN
  Administered 2022-08-04: 20 mL

## 2022-08-04 MED ORDER — ONDANSETRON HCL 4 MG/2ML IJ SOLN
INTRAMUSCULAR | Status: AC
  Filled 2022-08-04: qty 2

## 2022-08-04 MED ORDER — POVIDONE-IODINE 10 % EX SWAB
2.0000 | Freq: Once | CUTANEOUS | Status: AC
  Administered 2022-08-04: 2 via TOPICAL

## 2022-08-04 MED ORDER — BUPIVACAINE LIPOSOME 1.3 % IJ SUSP
INTRAMUSCULAR | Status: DC | PRN
  Administered 2022-08-04: 20 mL

## 2022-08-04 MED ORDER — SODIUM CHLORIDE 0.9 % IR SOLN
Status: DC | PRN
  Administered 2022-08-04: 3000 mL

## 2022-08-04 MED ORDER — DOCUSATE SODIUM 100 MG PO CAPS
100.0000 mg | ORAL_CAPSULE | Freq: Two times a day (BID) | ORAL | Status: DC
  Administered 2022-08-04 – 2022-08-05 (×2): 100 mg via ORAL
  Filled 2022-08-04 (×2): qty 1

## 2022-08-04 MED ORDER — IRRISEPT - 450ML BOTTLE WITH 0.05% CHG IN STERILE WATER, USP 99.95% OPTIME
TOPICAL | Status: DC | PRN
  Administered 2022-08-04 (×2): 450 mL

## 2022-08-04 MED ORDER — LIDOCAINE 2% (20 MG/ML) 5 ML SYRINGE
INTRAMUSCULAR | Status: AC
  Filled 2022-08-04: qty 5

## 2022-08-04 MED ORDER — ORAL CARE MOUTH RINSE: 15.0000 mL | Freq: Once | OROMUCOSAL | Status: AC

## 2022-08-04 MED ORDER — HYDROMORPHONE HCL 1 MG/ML IJ SOLN: 0.2500 mg | INTRAMUSCULAR | Status: DC | PRN

## 2022-08-04 MED ORDER — MIDAZOLAM HCL 2 MG/2ML IJ SOLN
INTRAMUSCULAR | Status: AC
  Filled 2022-08-04: qty 2

## 2022-08-04 MED ORDER — TRANEXAMIC ACID 1000 MG/10ML IV SOLN
INTRAVENOUS | Status: DC | PRN
  Administered 2022-08-04: 2000 mg via TOPICAL

## 2022-08-04 MED ORDER — CLONIDINE HCL (ANALGESIA) 100 MCG/ML EP SOLN
EPIDURAL | Status: AC
  Filled 2022-08-04: qty 10

## 2022-08-04 MED ORDER — MENTHOL 3 MG MT LOZG: 1.0000 | LOZENGE | OROMUCOSAL | Status: DC | PRN

## 2022-08-04 MED ORDER — FENTANYL CITRATE (PF) 250 MCG/5ML IJ SOLN
INTRAMUSCULAR | Status: AC
  Filled 2022-08-04: qty 5

## 2022-08-04 MED ORDER — BUPIVACAINE-EPINEPHRINE (PF) 0.5% -1:200000 IJ SOLN
INTRAMUSCULAR | Status: DC | PRN
  Administered 2022-08-04: 20 mL via PERINEURAL

## 2022-08-04 MED ORDER — PROPOFOL 500 MG/50ML IV EMUL
INTRAVENOUS | Status: DC | PRN
  Administered 2022-08-04: 75 ug/kg/min via INTRAVENOUS

## 2022-08-04 SURGICAL SUPPLY — 88 items
BAG COUNTER SPONGE SURGICOUNT (BAG) ×2 IMPLANT
BAG DECANTER FOR FLEXI CONT (MISCELLANEOUS) ×2 IMPLANT
BAG SPNG CNTER NS LX DISP (BAG) ×1
BANDAGE ESMARK 6X9 LF (GAUZE/BANDAGES/DRESSINGS) ×2 IMPLANT
BLADE SAG 18X100X1.27 (BLADE) ×2 IMPLANT
BLADE SAGITTAL (BLADE) ×1
BLADE SAW THK.89X75X18XSGTL (BLADE) ×2 IMPLANT
BNDG CMPR 9X6 STRL LF SNTH (GAUZE/BANDAGES/DRESSINGS) ×1
BNDG CMPR MED 15X6 ELC VLCR LF (GAUZE/BANDAGES/DRESSINGS) ×1
BNDG COHESIVE 6X5 TAN STRL LF (GAUZE/BANDAGES/DRESSINGS) ×2 IMPLANT
BNDG ELASTIC 6X15 VLCR STRL LF (GAUZE/BANDAGES/DRESSINGS) ×2 IMPLANT
BNDG ESMARK 6X9 LF (GAUZE/BANDAGES/DRESSINGS) ×1
BOWL SMART MIX CTS (DISPOSABLE) IMPLANT
CLSR STERI-STRIP ANTIMIC 1/2X4 (GAUZE/BANDAGES/DRESSINGS) IMPLANT
CNTNR URN SCR LID CUP LEK RST (MISCELLANEOUS) ×2 IMPLANT
COMP FEM KNEE STD PS 9 LT (Joint) ×1 IMPLANT
COMP PATELLAR 10X35 METAL (Joint) ×1 IMPLANT
COMP TIB PS G 0D LT (Joint) ×1 IMPLANT
COMPONENT FEM KNEE STD PS 9 LT (Joint) IMPLANT
COMPONENT PATELLAR 10X35 METAL (Joint) IMPLANT
COMPONET TIB PS G 0D LT (Joint) IMPLANT
CONT SPEC 4OZ STRL OR WHT (MISCELLANEOUS) ×1
COOLER ICEMAN CLASSIC (MISCELLANEOUS) IMPLANT
COVER SURGICAL LIGHT HANDLE (MISCELLANEOUS) ×2 IMPLANT
CUFF TOURN SGL QUICK 34 (TOURNIQUET CUFF) ×1
CUFF TOURN SGL QUICK 42 (TOURNIQUET CUFF) IMPLANT
CUFF TRNQT CYL 34X4.125X (TOURNIQUET CUFF) ×2 IMPLANT
DRAPE INCISE IOBAN 66X45 STRL (DRAPES) IMPLANT
DRAPE ORTHO SPLIT 77X108 STRL (DRAPES) ×5
DRAPE SURG ORHT 6 SPLT 77X108 (DRAPES) ×6 IMPLANT
DRAPE U-SHAPE 47X51 STRL (DRAPES) ×2 IMPLANT
DRSG AQUACEL AG ADV 3.5X14 (GAUZE/BANDAGES/DRESSINGS) IMPLANT
DURAPREP 26ML APPLICATOR (WOUND CARE) ×4 IMPLANT
ELECT CAUTERY BLADE 6.4 (BLADE) ×2 IMPLANT
ELECT REM PT RETURN 9FT ADLT (ELECTROSURGICAL) ×1
ELECTRODE REM PT RTRN 9FT ADLT (ELECTROSURGICAL) ×2 IMPLANT
GAUZE SPONGE 4X4 12PLY STRL (GAUZE/BANDAGES/DRESSINGS) ×2 IMPLANT
GLOVE BIOGEL PI IND STRL 7.0 (GLOVE) ×2 IMPLANT
GLOVE BIOGEL PI IND STRL 8 (GLOVE) ×2 IMPLANT
GLOVE ECLIPSE 7.0 STRL STRAW (GLOVE) ×2 IMPLANT
GLOVE ECLIPSE 8.0 STRL XLNG CF (GLOVE) ×2 IMPLANT
GLOVE SURG ENC MOIS LTX SZ6.5 (GLOVE) ×6 IMPLANT
GOWN STRL REUS W/ TWL LRG LVL3 (GOWN DISPOSABLE) ×6 IMPLANT
GOWN STRL REUS W/TWL LRG LVL3 (GOWN DISPOSABLE) ×3
HANDPIECE INTERPULSE COAX TIP (DISPOSABLE) ×1
HDLS TROCR DRIL PIN KNEE 75 (PIN) ×1
HOOD PEEL AWAY FLYTE STAYCOOL (MISCELLANEOUS) ×6 IMPLANT
IMMOBILIZER KNEE 22 UNIV (SOFTGOODS) IMPLANT
IMMOBILIZER KNEE 24 THIGH 36 (MISCELLANEOUS) IMPLANT
IMMOBILIZER KNEE 24 UNIV (MISCELLANEOUS)
INSERT ARTISURF S8-11 18X22X14 (Insert) IMPLANT
INSERT COMP PS 8-11 GH LT (Insert) IMPLANT
KIT BASIN OR (CUSTOM PROCEDURE TRAY) ×2 IMPLANT
KIT TURNOVER KIT B (KITS) ×2 IMPLANT
MANIFOLD NEPTUNE II (INSTRUMENTS) ×2 IMPLANT
NDL 22X1.5 STRL (OR ONLY) (MISCELLANEOUS) ×4 IMPLANT
NDL SPNL 18GX3.5 QUINCKE PK (NEEDLE) ×2 IMPLANT
NEEDLE 22X1.5 STRL (OR ONLY) (MISCELLANEOUS) ×2 IMPLANT
NEEDLE SPNL 18GX3.5 QUINCKE PK (NEEDLE) ×1 IMPLANT
NS IRRIG 1000ML POUR BTL (IV SOLUTION) ×4 IMPLANT
PACK TOTAL JOINT (CUSTOM PROCEDURE TRAY) ×2 IMPLANT
PAD ARMBOARD 7.5X6 YLW CONV (MISCELLANEOUS) ×4 IMPLANT
PAD CAST 3X4 CTTN HI CHSV (CAST SUPPLIES) IMPLANT
PAD CAST 4YDX4 CTTN HI CHSV (CAST SUPPLIES) ×2 IMPLANT
PAD ORTHO SHOULDER 7X19 LRG (SOFTGOODS) IMPLANT
PADDING CAST COTTON 3X4 STRL (CAST SUPPLIES) ×1
PADDING CAST COTTON 4X4 STRL (CAST SUPPLIES) ×1
PADDING CAST COTTON 6X4 STRL (CAST SUPPLIES) ×2 IMPLANT
PIN DRILL HDLS TROCAR 75 4PK (PIN) IMPLANT
SCREW FEMALE HEX FIX 25X2.5 (ORTHOPEDIC DISPOSABLE SUPPLIES) IMPLANT
SET HNDPC FAN SPRY TIP SCT (DISPOSABLE) ×2 IMPLANT
SPIKE FLUID TRANSFER (MISCELLANEOUS) ×2 IMPLANT
STRIP CLOSURE SKIN 1/2X4 (GAUZE/BANDAGES/DRESSINGS) ×4 IMPLANT
SUCTION FRAZIER HANDLE 10FR (MISCELLANEOUS) ×1
SUCTION TUBE FRAZIER 10FR DISP (MISCELLANEOUS) ×2 IMPLANT
SUT MNCRL AB 3-0 PS2 18 (SUTURE) ×2 IMPLANT
SUT VIC AB 0 CT1 27 (SUTURE) ×6
SUT VIC AB 0 CT1 27XBRD ANBCTR (SUTURE) ×6 IMPLANT
SUT VIC AB 1 CT1 36 (SUTURE) ×10 IMPLANT
SUT VIC AB 2-0 CT1 27 (SUTURE) ×4
SUT VIC AB 2-0 CT1 TAPERPNT 27 (SUTURE) ×8 IMPLANT
SYR 30ML LL (SYRINGE) ×6 IMPLANT
SYR TB 1ML LUER SLIP (SYRINGE) ×2 IMPLANT
TOWEL GREEN STERILE (TOWEL DISPOSABLE) ×4 IMPLANT
TOWEL GREEN STERILE FF (TOWEL DISPOSABLE) ×4 IMPLANT
TRAY CATH 16FR W/PLASTIC CATH (SET/KITS/TRAYS/PACK) IMPLANT
WATER STERILE IRR 1000ML POUR (IV SOLUTION) IMPLANT
YANKAUER SUCT BULB TIP NO VENT (SUCTIONS) ×2 IMPLANT

## 2022-08-04 NOTE — Progress Notes (Signed)
Pt. Arrived to unit alert and oriented no c/o pain, was able to ambulate from wheelchair to recliner. Pt. Notified family via personal phone. Chair locked call bell within reach

## 2022-08-04 NOTE — Transfer of Care (Signed)
Immediate Anesthesia Transfer of Care Note  Patient: [name]  Procedure(s) Performed: LEFT TOTAL KNEE ARTHROPLASTY, HARDWARE REMOVAL (Left: Knee)  Patient Location: PACU  Anesthesia Type:Spinal and MAC combined with regional for post-op pain  Level of Consciousness: drowsy and patient cooperative  Airway & Oxygen Therapy: Patient Spontanous Breathing  Post-op Assessment: Report given to RN, Post -op Vital signs reviewed and stable and Patient moving all extremities  Post vital signs: Reviewed and stable  Last Vitals:  Vitals Value Taken Time  BP 115/78 08/04/22 1124  Temp    Pulse 72 08/04/22 1126  Resp 13 08/04/22 1126  SpO2 91 % 08/04/22 1126  Vitals shown include unvalidated device data.  Last Pain:  Vitals:   08/04/22 0615  TempSrc: Oral  PainSc:          Complications: No notable events documented.

## 2022-08-04 NOTE — Progress Notes (Signed)
Orthopedic Tech Progress Note Patient Details:  [name] Jul 02, 1951 [id number]  Went to PACU to apply CPM but PT showed up first, so I will try this again once PT is finish.  Patient ID: [name], male   DOB: Apr 19, 1951, 71 y.o.   MRN: [id number]  [name] 08/04/2022, 2:13 PM

## 2022-08-04 NOTE — Anesthesia Postprocedure Evaluation (Signed)
Anesthesia Post Note  Patient: [name]  Procedure(s) Performed: LEFT TOTAL KNEE ARTHROPLASTY, HARDWARE REMOVAL (Left: Knee)     Patient location during evaluation: PACU Anesthesia Type: Spinal and Regional Level of consciousness: oriented and awake and alert Pain management: pain level controlled Vital Signs Assessment: post-procedure vital signs reviewed and stable Respiratory status: spontaneous breathing and respiratory function stable Cardiovascular status: blood pressure returned to baseline and stable Postop Assessment: no headache, no backache, no apparent nausea or vomiting, spinal receding and patient able to bend at knees Anesthetic complications: no   No notable events documented.  Last Vitals:  Vitals:   08/04/22 1145 08/04/22 1200  BP: 95/75 95/62  Pulse: 60 (!) 55  Resp: 20 10  Temp:    SpO2: 91% 90%    Last Pain:  Vitals:   08/04/22 1200  TempSrc:   PainSc: Asleep                 Pama Roskos,W. EDMOND

## 2022-08-04 NOTE — Evaluation (Signed)
Physical Therapy Evaluation Patient Details Name: [name] MRN: [id number] DOB: Jul 07, 1951 Today's Date: 08/04/2022  History of Present Illness  Patient is a 71 y/o male who presents on 10/3 for left TKA and hardware removal.  Clinical Impression  Patient presents with pain and post surgical deficits s/p above surgery. 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 Today, pt requires MIn guard assist for transfers and gait training with use of RW for support. Cues needed for RW management/proximity and gait sequencing. Some mild left knee instability noted but no buckling. Education on knee precautions, positioning, HEP etc. Pt has support from wife at home. Will follow acutely to maximize independence and mobility prior to return home. Will plan for stair training and HEP handout tomorrow morning prior to d/c.      Recommendations for follow up therapy are one component of a multi-disciplinary discharge planning process, led by the attending physician.  Recommendations may be updated based on patient status, additional functional criteria and insurance authorization.  Follow Up Recommendations Follow physician's recommendations for discharge plan and follow up therapies      Assistance Recommended at Discharge Intermittent Supervision/Assistance  Patient can return home with the following  A little help with walking and/or transfers;Assistance with cooking/housework;Help with stairs or ramp for entrance;Assist for transportation    Equipment Recommendations None recommended by PT  Recommendations for Other Services       Functional Status Assessment Patient has had a recent decline in their functional status and demonstrates the ability to make significant improvements in function in a reasonable and predictable amount of time.     Precautions / Restrictions Precautions Precautions: Knee Precaution Booklet Issued:  No Precaution Comments: Reviewed no pillow under knee and precautions Restrictions Weight Bearing Restrictions: Yes LLE Weight Bearing: Weight bearing as tolerated      Mobility  Bed Mobility Overal bed mobility: Modified Independent             General bed mobility comments: No assist needed.    Transfers Overall transfer level: Needs assistance Equipment used: Rolling walker (2 wheels) Transfers: Sit to/from Stand Sit to Stand: Min guard           General transfer comment: Min guard for safety. Stood from [employer], cues for hand placement/technique as pt wanting to pull up on RW.    Ambulation/Gait Ambulation/Gait assistance: Min guard, Min assist Gait Distance (Feet): 150 Feet Assistive device: Rolling walker (2 wheels) Gait Pattern/deviations: Step-through pattern, Decreased stance time - left, Trunk flexed Gait velocity: decreased     General Gait Details: SLow, mildly unsteady gait with cues for RW proximity/management, to roll it and not pick it up and for knee extension during stance phase to activate quads. Some mild knee instability noted of left knee but no buckling.  Stairs            Wheelchair Mobility    Modified Rankin (Stroke Patients Only)       Balance Overall balance assessment: Mild deficits observed, not formally tested                                           Pertinent Vitals/Pain Pain Assessment Pain Assessment: 0-10 Pain Score: 2  Pain Location: left knee with mobility Pain Descriptors / Indicators: Sore, Operative site guarding Pain Intervention(s): Monitored during  session, Repositioned, Ice applied    Home Living Family/patient expects to be discharged to:: Private residence Living Arrangements: Spouse/significant other Available Help at Discharge: Family;Available PRN/intermittently Type of Home: House Home Access: Stairs to enter Entrance Stairs-Rails: Right Entrance Stairs-Number of Steps: 3    Home Layout: Two level;Able to live on main level with bedroom/bathroom Home Equipment: Rolling Walker (2 wheels)      Prior Function Prior Level of Function : Independent/Modified Independent             Mobility Comments: Independent, loves to bike. 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 [employer]: independent     Hand Dominance        Extremity/Trunk Assessment   Upper Extremity Assessment Upper Extremity Assessment: Defer to OT evaluation    Lower Extremity Assessment Lower Extremity Assessment: LLE deficits/detail LLE Deficits / Details: good AROM at knee and ankle, mild instability noted at knee during mobility LLE Sensation: WNL LLE Coordination: WNL    Cervical / Trunk Assessment Cervical / Trunk Assessment: Normal  Communication   Communication: No difficulties  Cognition Arousal/Alertness: Awake/alert Behavior During Therapy: WFL for tasks assessed/performed Overall Cognitive Status: Within Functional Limits for tasks assessed                                          General Comments      Exercises Total Joint Exercises Ankle Circles/Pumps: AROM, Both, 10 reps, Supine Quad Sets: AROM, Both, 10 reps, Supine Gluteal Sets: AROM, Both, 5 reps, Supine Long Arc Quad: AROM, Left, 5 reps, Seated   Assessment/Plan    PT Assessment Patient needs continued PT services  PT Problem List Decreased range of motion;Decreased strength;Decreased mobility;Decreased knowledge of precautions;Pain;Decreased balance;Decreased knowledge of use of DME;Decreased skin integrity       PT Treatment Interventions Therapeutic activities;DME instruction;Gait training;Therapeutic exercise;Stair training;Balance training;Patient/family education;Functional mobility training    PT Goals (Current goals can be found in the Care Plan section)  Acute Rehab PT Goals Patient Stated Goal: return to PLOF/independence PT Goal Formulation: With  patient Time For Goal Achievement: 08/18/22 Potential to Achieve Goals: Good    Frequency 7X/week     Co-evaluation               AM-PAC PT "6 Clicks" Mobility  Outcome Measure Help needed turning from your back to your side while in a flat bed without using bedrails?: None Help needed moving from lying on your back to sitting on the side of a flat bed without using bedrails?: None Help needed moving to and from a bed to a chair (including a wheelchair)?: A Little Help needed standing up from a chair using your arms (e.g., wheelchair or bedside chair)?: A Little Help needed to walk in hospital room?: A Little Help needed climbing 3-5 steps with a railing? : A Little 6 Click Score: 20    End of Session Equipment Utilized During Treatment: Gait belt Activity Tolerance: Patient tolerated treatment well Patient left: in bed;with call bell/phone within reach Nurse Communication: Mobility status PT Visit Diagnosis: Pain;Difficulty in walking, not elsewhere classified (R26.2) Pain - Right/Left: Left Pain - part of body: Knee    Time: 9326-7124 PT Time Calculation (min) (ACUTE ONLY): 21 min   Charges:   PT Evaluation $PT Eval Moderate Complexity: 1 Mod          Deanza Upperman H, PT, DPT Acute  Rehabilitation Services Secure chat preferred Office [place] 08/04/2022, 3:13 PM

## 2022-08-04 NOTE — Anesthesia Procedure Notes (Signed)
Spinal  Patient location during procedure: OR Start time: 08/04/2022 7:40 AM End time: 08/04/2022 7:45 AM Reason for block: surgical anesthesia Staffing Performed: anesthesiologist  Anesthesiologist: Roderic Palau, MD Performed by: Roderic Palau, MD Authorized by: Roderic Palau, MD   Preanesthetic Checklist Completed: patient identified, IV checked, risks and benefits discussed, surgical consent, monitors and equipment checked, pre-op evaluation and timeout performed Spinal Block Patient position: sitting Prep: DuraPrep Patient monitoring: cardiac monitor, continuous pulse ox and blood pressure Approach: midline Location: L3-4 Injection technique: single-shot Needle Needle type: Pencan  Needle gauge: 24 G Needle length: 9 cm Assessment Sensory level: T8 Events: CSF return Additional Notes Functioning IV was confirmed and monitors were applied. Sterile prep and drape, including hand hygiene and sterile gloves were used. The patient was positioned and the spine was prepped. The skin was anesthetized with lidocaine.  Free flow of clear CSF was obtained prior to injecting local anesthetic into the CSF.  The spinal needle aspirated freely following injection.  The needle was carefully withdrawn.  The patient tolerated the procedure well.

## 2022-08-04 NOTE — Progress Notes (Signed)
Orthopedic Tech Progress Note Patient Details:  [name] 1951/10/10 [id number]  Someone applied CPM when patient was in PACU   Patient ID: [name], male   DOB: 02/11/1951, 71 y.o.   MRN: [id number]  [name] 08/04/2022, 4:00 PM

## 2022-08-04 NOTE — Anesthesia Procedure Notes (Signed)
Anesthesia Regional Block: Adductor canal block   Pre-Anesthetic Checklist: , timeout performed,  Correct Patient, Correct Site, Correct Laterality,  Correct Procedure, Correct Position, site marked,  Risks and benefits discussed,  Pre-op evaluation,  At surgeon's request and post-op pain management  Laterality: Left  Prep: Maximum Sterile Barrier Precautions used, chloraprep       Needles:  Injection technique: Single-shot  Needle Type: Echogenic Stimulator Needle     Needle Length: 9cm  Needle Gauge: 21     Additional Needles:   Procedures:,,,, ultrasound used (permanent image in chart),,    Narrative:  Start time: 08/04/2022 6:43 AM End time: 08/04/2022 6:53 AM Injection made incrementally with aspirations every 5 mL.  Performed by: Personally  Anesthesiologist: Roderic Palau, MD

## 2022-08-04 NOTE — Brief Op Note (Signed)
   08/04/2022  11:54 AM  PATIENT:  Claris Che  71 y.o. male  PRE-OPERATIVE DIAGNOSIS:  LEFT KNEE OSTEOARTHRITIS  POST-OPERATIVE DIAGNOSIS:  LEFT KNEE OSTEOARTHRITIS  PROCEDURE:  Procedure(s): LEFT TOTAL KNEE ARTHROPLASTY, HARDWARE REMOVAL  SURGEON:  Surgeon(s): Marlou Sa, Tonna Corner, MD  ASSISTANT: Annie Main, PA  ANESTHESIA: Spinal  EBL: 50 ml    Total I/O In: 1800 [I.V.:1600; IV Piggyback:200] Out: 650 [Urine:600; Blood:50]  BLOOD ADMINISTERED: none  DRAINS: none   LOCAL MEDICATIONS USED: Marcaine morphine clonidine Exparel vancomycin powder  SPECIMEN:  No Specimen  COUNTS:  YES  TOURNIQUET:   Total Tourniquet Time Documented: Thigh (Left) - 120 minutes Total: Thigh (Left) - 120 minutes   DICTATION: .Other Dictation: Dictation Number [id number]  PLAN OF CARE: Admit for overnight observation  PATIENT DISPOSITION:  PACU - hemodynamically stable             Annie Main, [place]

## 2022-08-04 NOTE — Op Note (Unsigned)
NAMEBRAYDN, CARNEIRO MEDICAL RECORD NO: [id number] ACCOUNT NO: [id number] DATE OF BIRTH: [birth year] FACILITY: [facility] LOCATION: [facility] PHYSICIAN: Yetta Barre. Marlou Sa, MD  Operative Report   DATE OF PROCEDURE: 08/04/2022  PREOPERATIVE DIAGNOSIS:  Left knee arthritis with retained hardware.  POSTOPERATIVE DIAGNOSIS:  Left knee arthritis with retained hardware.  PROCEDURE:  Left total knee replacement using Persona press-fit components cruciate retaining size 9 femur, size G spiked press-fit tibia with 37 mm patella and size 11 medial congruent poly.  INDICATIONS:  This is a 71 year old patient with left knee ACL reconstruction done over 20 years ago.  He has severe end-stage tricompartmental arthritis refractory to nonoperative management.  He presents now for operative management after explanation  of risks and benefits.  DESCRIPTION OF PROCEDURE:  The patient was brought to the operating room where spinal anesthetic was induced.  Preoperative antibiotics administered.  Timeout was called.  Left leg was prescrubbed with alcohol and Betadine, allowed to air dry, prepped  with DuraPrep solution and draped in sterile manner.  The patient had about 5 degree preoperative flexion contracture and bent to about 110.  After prescrubbing the leg was prepped with DuraPrep solution and draped in sterile manner.  Ioban used to cover  the operative field.  Timeout was called.  Left leg was then elevated and exsanguinated with an Esmarch wrap.  Tourniquet was inflated.  Initially tourniquet pressure was 300 that later had to be increased to 325 due to some breakthrough venous  bleeding.  The anterior approach to the knee was made.  Skin and subcutaneous tissue sharply divided.  Previous ACL reconstruction incision was utilized.  The ACL screw in the tibia was localized under fluoroscopy.  The screw head was exposed within the  tibia itself.  This required some curettage of the bone, which was very hard.  The  screw was removed.  Thorough irrigation of this area was then performed.  Next, a Median parapatellar arthrotomy was made and marked #1 Vicryl suture.  IrriSept solution  used both before and after the arthrotomy and at multiple times during the case.  Next, a soft tissue sleeve was elevated off the medial side proportional to the patient's preoperative varus deformity.  Lateral patellofemoral ligament was released.  Fat  pad was partially excised.  The patient had overall a very tight knee.  The soft tissue removed from the anterior distal femur.  Next, the patella was everted.  Knee was flexed.  Severe tricompartmental arthritis was present.  ACL was released.  Anterior  horn of the lateral meniscus was released.  Intramedullary alignment was then used to cut the tibia perpendicular to the mechanical axis. Initially a 2-3 mm cut made off the most affected medial tibial plateau that was later revised 2 more millimeters  in order to get below severely sclerotic bone on the tibial plateau medially because of the press-fit knee.  Bone quality was excellent.  Femur was then cut in 5 degrees of valgus using intramedullary alignment as well.  Collaterals were protected during  the tibial cut.  A 10 mm spacer fit in nicely after those cuts.  The femur was then sized to a size 9 and the anterior and posterior and chamfer cuts were made with about 3 degrees of external rotation.  Next, trials were placed.  The patient had a  trial components in positioning. The patella was then cut down from a 25 to about 15 mm.  Trial button was placed.  Patella was lateralized on the  native patella to facilitate patellar tracking.  With trial components in position, the patient was first  trialled with the 10, 11 and then 12 spacer.  The 11 spacer gave full extension, good stability to varus and valgus stress at 0, 30 and 90 degrees, The trial components removed.  The patella did track well, but required a small lateral release to  make  the tracking perfect.  At this time, trial components were removed.  Final preparations was made on both the femur and the tibia with keel punch.  At this time, 3 liters of pulsatile irrigating solution was utilized.  Bone plug placed into the distal  femur.  Tranexamic acid sponge allowed to sit in the knee for 3 minutes along with IrriSept solution.  These were removed, and the true components were tapped into position with excellent press fit obtained in both the femur, tibia and the patella.  The  bone quality was excellent.  Next, the 11 spacer was placed and the patient achieved full extension, full flexion, and very good patellar stability.  Tourniquet was released, bleeding points encountered controlled using electrocautery.  Thorough  irrigation was again performed with pouring irrigation.  Arthrotomy was then closed over bolster by using the previously placed marking suture for precise realignment of the extensor mechanism.  Arthrotomy was closed using #1 Vicryl suture.  Prior to  final arthrotomy closure the knee joint was once again irrigated with IrriSept solution and vancomycin powder was placed.  It should also be noted that the tibial canal prior to placement of the implant was also irrigated with IrriSept solution.   Vancomycin powder placed there as well.  The hole was bone grafted from the screw removal as.  Next, a solution of Marcaine, morphine, clonidine injected into the knee for postoperative pain relief. It should also be noted that the capsule was  anesthetized, prior to placing the TXA sponge using Marcaine, Exparel and saline.  Final arthrotomy closure was performed with #1 Vicryl suture followed by interrupted inverted 0 Vicryl suture, 2-0 Vicryl suture, and 3-0 Monocryl with Steri-Strips and  Aquacel dressing, Ace wrap, iceman and knee immobilizer placed.  Luke's assistance was required at all times during the case for retraction, opening, closing, mobilization of tissue,  drilling, sawing.  His assistance was a medical necessity.   PUS D: 08/04/2022 12:02:58 pm T: 08/04/2022 1:12:00 pm  JOB: [id number]/ [id number]

## 2022-08-04 NOTE — H&P (Signed)
TOTAL KNEE ADMISSION H&P  Patient is being admitted for left total knee arthroplasty.  Subjective:  Chief Complaint:left knee pain.  HPI: [name], 71 y.o. male, has a history of pain and functional disability in the left knee due to arthritis and has failed non-surgical conservative treatments for greater than 12 weeks to includeNSAID's and/or analgesics, corticosteriod injections, viscosupplementation injections, flexibility and strengthening excercises, supervised PT with diminished ADL's post treatment, weight reduction as appropriate, and activity modification.  Onset of symptoms was gradual, starting >10 years ago with gradually worsening course since that time. The patient noted prior procedures on the knee to include  arthroscopy, menisectomy, and ACL reconstruction on the left knee(s).  Patient currently rates pain in the left knee(s) at 8 out of 10 with activity. Patient has night pain, worsening of pain with activity and weight bearing, pain that interferes with activities of daily living, pain with passive range of motion, crepitus, and joint swelling.  Patient has evidence of subchondral sclerosis and joint space narrowing by imaging studies. This patient has had  a long history of left knee pain which has been refractory to nonoperative management.  No personal or family history of DVT or pulmonary embolism . There is no active infection.  Patient Active Problem List   Diagnosis Date Noted   Infected cyst of skin 08/19/2012   Past Medical History:  Diagnosis Date   GERD (gastroesophageal reflux disease)    History of adenomatous polyp of colon    2004  hyperplastic and tubular adenoma/  2012 hyperplastic   History of aspiration pneumonitis    03/ 2015   HSV-1 (herpes simplex virus 1) infection    chronic fever blister's   Hyperlipidemia    Phimosis    Snores    wears snore night gaurd /  per pt no sleep study   Wears glasses     Past Surgical History:  Procedure  Laterality Date   ARTHROSCOPIC REPAIR ACL Left 1992   CIRCUMCISION N/A 08/06/2016   Procedure: CIRCUMCISION ADULT;  Surgeon: Ardis Hughs, MD;  [facility];  Service: Urology;  Laterality: N/A;   COLONOSCOPY  last one 05-29-2011   EAR CYST EXCISION  08/19/2012   Procedure: CYST REMOVAL;  Surgeon: Jerrell Belfast, MD;  Location: [place];  Service: ENT;  Laterality: Left;  excision of left  infra auricular cyst    INGUINAL HERNIA REPAIR Right 1980's   KNEE ARTHROSCOPY Left 04/22/2006    Current Facility-Administered Medications  Medication Dose Route Frequency Provider Last Rate Last Admin   acetaminophen (TYLENOL) tablet 1,000 mg  1,000 mg Oral Once Roderic Palau, MD       ceFAZolin (ANCEF) 2-4 GM/100ML-% IVPB            ceFAZolin (ANCEF) IVPB 2g/100 mL premix  2 g Intravenous On Call to [place], Gerrianne Scale, PA-C       lactated ringers infusion   Intravenous Continuous Roderic Palau, MD   New Bag at 08/04/22 0645   povidone-iodine (BETADINE) 7.5 % scrub   Topical Once Magnant, Charles L, PA-C       povidone-iodine 10 % swab 2 Application  2 Application Topical Once Magnant, Charles L, PA-C       tranexamic acid (CYKLOKAPRON) '1000MG'$ /136m IVPB            tranexamic acid (CYKLOKAPRON) 2,000 mg in sodium chloride 0.9 % 50 mL Topical Application  22,706mg Topical To OR Magnant, CGerrianne Scale  PA-C       tranexamic acid (CYKLOKAPRON) IVPB 1,000 mg  1,000 mg Intravenous To OR Magnant, Charles L, PA-C       Facility-Administered Medications Ordered in Other Encounters  Medication Dose Route Frequency Provider Last Rate Last Admin   fentaNYL (SUBLIMAZE) injection   Intravenous Anesthesia Intra-op Moshe Salisbury, CRNA   50 mcg at 08/04/22 9767   midazolam (VERSED) 5 MG/5ML injection   Intravenous Anesthesia Intra-op Moshe Salisbury, CRNA   2 mg at 08/04/22 3419   Allergies  Allergen Reactions   Codeine Hives    Social History   Tobacco  Use   Smoking status: Never   Smokeless tobacco: Never  Substance Use Topics   Alcohol use: No    Family History  Problem Relation Age of Onset   Colon cancer Neg Hx    Colon polyps Neg Hx      Review of Systems  Musculoskeletal:  Positive for arthralgias.  All other systems reviewed and are negative.   Objective:  Physical Exam Vitals reviewed.  HENT:     Head: Normocephalic.     Nose: Nose normal.     Mouth/Throat:     Mouth: Mucous membranes are moist.  Eyes:     Pupils: Pupils are equal, round, and reactive to light.  Cardiovascular:     Rate and Rhythm: Normal rate.     Pulses: Normal pulses.  Pulmonary:     Effort: Pulmonary effort is normal.  Abdominal:     General: Abdomen is flat.  Musculoskeletal:     Cervical back: Normal range of motion.  Skin:    General: Skin is warm.     Capillary Refill: Capillary refill takes less than 2 seconds.  Neurological:     General: No focal deficit present.     Mental Status: He is alert.  Psychiatric:        Mood and Affect: Mood normal.    Examination of the left knee demonstrates slight varus alignment ankle dorsiflexion intact pedal pulses palpable range of motion is approximately 3-1 15.  Has good stability to varus and valgus stress.  Well-healed surgical incision on the anterior aspect of the knee consistent with bone patella tendon bone ACL reconstruction Vital signs in last 24 hours: Temp:  [97.9 F (36.6 C)] 97.9 F (36.6 C) (10/03 0615) Pulse Rate:  [71] 71 (10/03 0613) Resp:  [14] 14 (10/03 0613) BP: (120)/(85) 120/85 (10/03 0613) SpO2:  [95 %] 95 % (10/03 3790) Weight:  [96.2 kg] 96.2 kg (10/03 0613)  Labs:   Estimated body mass index is 31.31 kg/m as calculated from the following:   Height as of this encounter: '5\' 9"'$  (1.753 m).   Weight as of this encounter: 96.2 kg.   Imaging Review Plain radiographs demonstrate severe degenerative joint disease of the left knee(s). The overall alignment  ismild varus. The bone quality appears to be good for age and reported activity level.      Assessment/Plan:  End stage arthritis, left knee   The patient history, physical examination, clinical judgment of the provider and imaging studies are consistent with end stage degenerative joint disease of the left knee(s) and total knee arthroplasty is deemed medically necessary. The treatment options including medical management, injection therapy arthroscopy and arthroplasty were discussed at length. The risks and benefits of total knee arthroplasty were presented and reviewed. The risks due to aseptic loosening, infection, stiffness, patella tracking problems, thromboembolic complications and other imponderables were discussed.  The patient acknowledged the explanation, agreed to proceed with the plan and consent was signed. Patient is being admitted for inpatient treatment for surgery, pain control, PT, OT, prophylactic antibiotics, VTE prophylaxis, progressive ambulation and ADL's and discharge planning. The patient is planning to be discharged home with home health services we will also plan on hardware removal of the tibial ACL screw in order to allow for keel placement on the tibial component.  Anticipate press-fit knee replacement based on bone quality.     Patient's anticipated LOS is less than 2 midnights, meeting these requirements: - Younger than 74 - Lives within 1 hour of care - Has a competent adult at home to recover with post-op recover - NO history of  - Chronic pain requiring opiods  - Diabetes  - Coronary Artery Disease  - Heart failure  - Heart attack  - Stroke  - DVT/VTE  - Cardiac arrhythmia  - Respiratory Failure/COPD  - Renal failure  - Anemia  - Advanced Liver disease

## 2022-08-04 NOTE — Anesthesia Procedure Notes (Signed)
Procedure Name: MAC Date/Time: 08/04/2022 7:45 AM  Performed by: Moshe Salisbury, CRNAPre-anesthesia Checklist: Patient identified, Emergency Drugs available, Suction available and Patient being monitored Oxygen Delivery Method: Nasal cannula Ventilation: Nasal airway inserted- appropriate to patient size Placement Confirmation: positive ETCO2 Dental Injury: Teeth and Oropharynx as per pre-operative assessment

## 2022-08-05 ENCOUNTER — Observation Stay (HOSPITAL_BASED_OUTPATIENT_CLINIC_OR_DEPARTMENT_OTHER): Payer: 59

## 2022-08-05 ENCOUNTER — Encounter (HOSPITAL_COMMUNITY): Payer: Self-pay | Admitting: Orthopedic Surgery

## 2022-08-05 ENCOUNTER — Other Ambulatory Visit (HOSPITAL_COMMUNITY): Payer: Self-pay

## 2022-08-05 DIAGNOSIS — M7989 Other specified soft tissue disorders: Secondary | ICD-10-CM | POA: Diagnosis not present

## 2022-08-05 DIAGNOSIS — M1712 Unilateral primary osteoarthritis, left knee: Secondary | ICD-10-CM | POA: Diagnosis not present

## 2022-08-05 DIAGNOSIS — R600 Localized edema: Secondary | ICD-10-CM | POA: Diagnosis not present

## 2022-08-05 DIAGNOSIS — T8484XA Pain due to internal orthopedic prosthetic devices, implants and grafts, initial encounter: Secondary | ICD-10-CM | POA: Diagnosis not present

## 2022-08-05 LAB — BASIC METABOLIC PANEL
Anion gap: 8 (ref 5–15)
BUN: 15 mg/dL (ref 8–23)
CO2: 25 mmol/L (ref 22–32)
Calcium: 8.7 mg/dL — ABNORMAL LOW (ref 8.9–10.3)
Chloride: 103 mmol/L (ref 98–111)
Creatinine, Ser: 0.83 mg/dL (ref 0.61–1.24)
GFR, Estimated: >60 mL/min (ref >60)
Glucose, Bld: 118 mg/dL — ABNORMAL HIGH (ref 70–99)
Potassium: 4.2 mmol/L (ref 3.5–5.1)
Sodium: 136 mmol/L (ref 135–145)

## 2022-08-05 MED ORDER — DOCUSATE SODIUM 100 MG PO CAPS
100.0000 mg | ORAL_CAPSULE | Freq: Two times a day (BID) | ORAL | 0 refills | Status: DC
  Filled 2022-08-05 (×2): qty 10, 5d supply, fill #0

## 2022-08-05 MED ORDER — CELECOXIB 100 MG PO CAPS
100.0000 mg | ORAL_CAPSULE | Freq: Two times a day (BID) | ORAL | 0 refills | Status: DC
  Filled 2022-08-05: qty 30, 15d supply, fill #0

## 2022-08-05 MED ORDER — METHOCARBAMOL 500 MG PO TABS
500.0000 mg | ORAL_TABLET | Freq: Four times a day (QID) | ORAL | 0 refills | Status: DC | PRN
  Filled 2022-08-05: qty 30, 8d supply, fill #0

## 2022-08-05 MED ORDER — ACETAMINOPHEN 325 MG PO TABS
325.0000 mg | ORAL_TABLET | Freq: Four times a day (QID) | ORAL | 0 refills | Status: DC | PRN
  Filled 2022-08-05 (×2): qty 40, 5d supply, fill #0

## 2022-08-05 MED ORDER — ASPIRIN 81 MG PO CHEW
81.0000 mg | CHEWABLE_TABLET | Freq: Two times a day (BID) | ORAL | 0 refills | Status: DC
  Filled 2022-08-05 (×2): qty 30, 15d supply, fill #0

## 2022-08-05 MED ORDER — OXYCODONE HCL 5 MG PO TABS
5.0000 mg | ORAL_TABLET | ORAL | 0 refills | Status: DC | PRN
  Filled 2022-08-05: qty 30, 3d supply, fill #0

## 2022-08-05 NOTE — Progress Notes (Signed)
Ultrasound negative Is being discharged to home as patient has been checked off by physical therapy Follow-up in 2 weeks

## 2022-08-05 NOTE — Progress Notes (Signed)
Physical Therapy Treatment Patient Details Name: [name] MRN: [id number] DOB: 1951-09-06 Today's Date: 08/05/2022   History of Present Illness Patient is a 71 y/o male who presents on 10/3 for left TKA and hardware removal.    PT Comments    Continuing work on functional mobility and activity tolerance;  session focused on progressive amb and stair training;  Managing stairs well, and good progress with ambulation distnce; Worked on straight leg raises and seated knee flexion to address pt's specific concerns and questions; Overall moving well, stable L knee in stance; OK for dc home from PT standpoint    Recommendations for follow up therapy are one component of a multi-disciplinary discharge planning process, led by the attending physician.  Recommendations may be updated based on patient status, additional functional criteria and insurance authorization.  Follow Up Recommendations  Follow physician's recommendations for discharge plan and follow up therapies     Assistance Recommended at Discharge Intermittent Supervision/Assistance  Patient can return home with the following A little help with walking and/or transfers;Assistance with cooking/housework;Help with stairs or ramp for entrance;Assist for transportation   Equipment Recommendations  None recommended by PT    Recommendations for Other Services       Precautions / Restrictions Precautions Precautions: Knee Precaution Booklet Issued: Yes (comment) Precaution Comments: Pt educated to not allow any pillow or bolster under knee for healing with optimal range of motion.  Restrictions LLE Weight Bearing: Weight bearing as tolerated     Mobility  Bed Mobility Overal bed mobility: Modified Independent             General bed mobility comments: No assist needed.    Transfers Overall transfer level: Needs assistance Equipment used: Rolling walker (2 wheels) Transfers: Sit to/from Stand Sit to Stand: Min  guard           General transfer comment: Min guard for safety. Stood from [employer], cues for hand placement/technique as pt wanting to pull up on RW.    Ambulation/Gait Ambulation/Gait assistance: Min guard Gait Distance (Feet): 250 Feet Assistive device: Rolling walker (2 wheels) Gait Pattern/deviations: Step-through pattern, Decreased stance time - left, Trunk flexed Gait velocity: decreased     General Gait Details: Cues for step length, posture, and to activate quad for stance stability   Stairs Stairs: Yes Stairs assistance: Min guard Stair Management: One rail Right, Forwards, Step to pattern Number of Stairs: 10 (5x2) General stair comments: Cues for sequence, and to activate quad for stance stability when advancing RLE up or down step   Wheelchair Mobility    Modified Rankin (Stroke Patients Only)       Balance Overall balance assessment: Mild deficits observed, not formally tested                                          Cognition Arousal/Alertness: Awake/alert Behavior During Therapy: WFL for tasks assessed/performed Overall Cognitive Status: Within Functional Limits for tasks assessed                                          Exercises Total Joint Exercises Ankle Circles/Pumps: AROM, Both, 10 reps, Supine Quad Sets: AROM, Both, 10 reps, Supine Straight Leg Raises: AAROM, Left, 10 reps Knee Flexion: AROM, AAROM, Left, Seated Goniometric ROM:  approx -3-75 degrees    General Comments General comments (skin integrity, edema, etc.): Discussed car transfers      Pertinent Vitals/Pain Pain Assessment Pain Assessment: Faces Faces Pain Scale: Hurts a little bit Pain Location: left knee with mobility, especially flexion Pain Descriptors / Indicators: Sore, Operative site guarding Pain Intervention(s): Monitored during session    Home Living                          Prior Function            PT Goals  (current goals can now be found in the care plan section) Acute Rehab PT Goals Patient Stated Goal: return to PLOF/independence PT Goal Formulation: With patient Time For Goal Achievement: 08/18/22 Potential to Achieve Goals: Good Progress towards PT goals: Progressing toward goals    Frequency    7X/week      PT Plan Current plan remains appropriate    Co-evaluation              AM-PAC PT "6 Clicks" Mobility   Outcome Measure  Help needed turning from your back to your side while in a flat bed without using bedrails?: None Help needed moving from lying on your back to sitting on the side of a flat bed without using bedrails?: None Help needed moving to and from a bed to a chair (including a wheelchair)?: A Little Help needed standing up from a chair using your arms (e.g., wheelchair or bedside chair)?: A Little Help needed to walk in hospital room?: A Little Help needed climbing 3-5 steps with a railing? : A Little 6 Click Score: 20    End of Session Equipment Utilized During Treatment: Gait belt Activity Tolerance: Patient tolerated treatment well Patient left: in chair;with call bell/phone within reach;with family/visitor present Nurse Communication: Mobility status (OK for DC) PT Visit Diagnosis: Pain;Difficulty in walking, not elsewhere classified (R26.2) Pain - Right/Left: Left Pain - part of body: Knee     Time: 0932-6712 PT Time Calculation (min) (ACUTE ONLY): 43 min  Charges:  $Gait Training: 8-22 mins $Therapeutic Exercise: 8-22 mins $Therapeutic Activity: 8-22 mins                     [name], PT  Acute Rehabilitation Services Office [phone]    [name] 08/05/2022, 1:09 PM

## 2022-08-05 NOTE — Progress Notes (Signed)
Lower extremity venous duplex has been completed.   Preliminary results in CV Proc.   Jinny Blossom Hennesy Sobalvarro 08/05/2022 9:52 AM

## 2022-08-05 NOTE — TOC Transition Note (Signed)
Transition of Care [facility]) - CM/SW Discharge Note   Patient Details  Name: [name] MRN: [id number] Date of Birth: 05-10-1951  [facility] CM/SW Contact:  Sharin Mons, RN Phone Number: 08/05/2022, 10:16 AM   Clinical Narrative:    Patient will DC to: home Anticipated DC date: 08/05/2022 Family notified:wife Transport by: car  S/p left TKA and hardware removal, 10/3  Per MD patient ready for DC today pending therapy clearance. RN, patient, patient's wife and [facility] aware of DC. Wife states will assist with care once d/c to home.Pt with RW and CPM @ home. Pt without Rx MED concerns. Wife to provide transportation to home. Post hospital f/u noted on AVS.  RNCM will sign off for now as intervention is no longer needed. Please consult [place] again if new needs arise.    Final next level of care: [place] Barriers to Discharge: No Barriers Identified   Patient Goals and CMS Choice        Discharge Placement                       Discharge Plan and Services                          HH Arranged: PT [place] Agency: [place]        Social Determinants of Health (SDOH) Interventions     Readmission Risk Interventions     No data to display

## 2022-08-05 NOTE — Progress Notes (Signed)
  Subjective: Patient stable.  Pain controlled.   Objective: Vital signs in last 24 hours: Temp:  [97.6 F (36.4 C)-98.5 F (36.9 C)] 98.5 F (36.9 C) (10/04 0736) Pulse Rate:  [55-74] 74 (10/04 0736) Resp:  [10-20] 16 (10/04 0736) BP: (95-122)/(62-96) 119/80 (10/04 0736) SpO2:  [90 %-96 %] 96 % (10/04 0736)  Intake/Output from previous day: 10/03 0701 - 10/04 0700 In: 2000 [I.V.:1600; IV Piggyback:400] Out: 650 [Urine:600; Blood:50] Intake/Output this shift: No intake/output data recorded.  Exam:  Intact pulses distally Dorsiflexion/Plantar flexion intact Compartment soft  Labs: No results for input(s): "HGB" in the last 72 hours. No results for input(s): "WBC", "RBC", "HCT", "PLT" in the last 72 hours. Recent Labs    08/05/22 0322  NA 136  K 4.2  CL 103  CO2 25  BUN 15  CREATININE 0.83  GLUCOSE 118*  CALCIUM 8.7*   No results for input(s): "LABPT", "INR" in the last 72 hours.  Assessment/Plan: Plan at this time is discharge this afternoon.  Having some calf swelling and with history of topical testosterone use we will plan on ultrasound to rule out DVT.  Suspicion low at this time.  Use knee immobilizer until he can do straight leg raises x10 on his own.  Follow-up in 2 weeks.  Anticipate 1 versus 2 physical therapy sessions today and should be able to discharge home.   G Scott Naketa Daddario 08/05/2022, 8:12 AM

## 2022-08-05 NOTE — Progress Notes (Signed)
Orthopedic Tech Progress Note Patient Details:  [name] September 18, 1951 [id number]  Ortho Devices Type of Ortho Device: Bone foam zero knee Ortho Device/Splint Location: LLE Ortho Device/Splint Interventions: Ordered   Post Interventions Patient Tolerated: Well Instructions Provided: Care of device  [name] 08/05/2022, 12:06 PM

## 2022-08-06 DIAGNOSIS — Z96652 Presence of left artificial knee joint: Secondary | ICD-10-CM | POA: Diagnosis not present

## 2022-08-06 DIAGNOSIS — K219 Gastro-esophageal reflux disease without esophagitis: Secondary | ICD-10-CM | POA: Diagnosis not present

## 2022-08-06 DIAGNOSIS — Z471 Aftercare following joint replacement surgery: Secondary | ICD-10-CM | POA: Diagnosis not present

## 2022-08-06 DIAGNOSIS — Z85038 Personal history of other malignant neoplasm of large intestine: Secondary | ICD-10-CM | POA: Diagnosis not present

## 2022-08-06 DIAGNOSIS — Z8701 Personal history of pneumonia (recurrent): Secondary | ICD-10-CM | POA: Diagnosis not present

## 2022-08-06 DIAGNOSIS — E785 Hyperlipidemia, unspecified: Secondary | ICD-10-CM | POA: Diagnosis not present

## 2022-08-06 DIAGNOSIS — Z7982 Long term (current) use of aspirin: Secondary | ICD-10-CM | POA: Diagnosis not present

## 2022-08-06 NOTE — Discharge Summary (Signed)
Physician Discharge Summary      Patient ID: [name] MRN: [id number] DOB/AGE: November 15, 1950 71 y.o.  Admit date: 08/04/2022 Discharge date: 08/05/2022  Admission Diagnoses:  Principal Problem:   OA (osteoarthritis) of knee Active Problems:   S/P knee replacement   Discharge Diagnoses:  Same  Surgeries: Procedure(s): LEFT TOTAL KNEE ARTHROPLASTY, HARDWARE REMOVAL on 08/04/2022   Consultants:   Discharged Condition: Stable  Hospital Course: [name] is an 71 y.o. male who was admitted 08/04/2022 with a chief complaint of left knee pain, and found to have a diagnosis of OA (osteoarthritis) of knee.  They were brought to the operating room on 08/04/2022 and underwent the above named procedures.  Patient tolerated the procedure well and was discharged home on postop day #1.  He was mobilizing with physical therapy and tolerating both hallway ambulation as well as stairs.  He is discharged home in good condition.  Ultrasound prior to discharge was negative for any type of DVT in that left lower extremity.  Discharged home in good condition on pain medicine DVT prophylaxis and muscle relaxers.  He will follow-up with [place] in 12 days.  Antibiotics given:  Anti-infectives (From admission, onward)    Start     Dose/Rate Route Frequency Ordered Stop   08/04/22 1700  ceFAZolin (ANCEF) IVPB 2g/100 mL premix        2 g 200 mL/hr over 30 Minutes Intravenous Every 8 hours 08/04/22 1605 08/05/22 0602   08/04/22 0856  vancomycin (VANCOCIN) powder  Status:  Discontinued          As needed 08/04/22 0856 08/04/22 1119   08/04/22 0600  ceFAZolin (ANCEF) IVPB 2g/100 mL premix        2 g 200 mL/hr over 30 Minutes Intravenous On call to O.R. 08/04/22 0554 08/04/22 0750   08/04/22 0600  ceFAZolin (ANCEF) 2-4 GM/100ML-% IVPB       Note to Pharmacy: Rocky Morel D: cabinet override      08/04/22 0600 08/04/22 0821     .  Recent vital signs:  Vitals:   08/04/22 1546 08/05/22 0736  BP:  122/83 119/80  Pulse: 63 74  Resp: 17 16  Temp: 98 F (36.7 C) 98.5 F (36.9 C)  SpO2: 95% 96%    Recent laboratory studies:  Results for orders placed or performed during the hospital encounter of 08/04/22  Surgical pcr screen   Specimen: Nasal Mucosa; Nasal Swab  Result Value Ref Range   MRSA, PCR NEGATIVE NEGATIVE   Staphylococcus aureus NEGATIVE NEGATIVE  Basic metabolic panel  Result Value Ref Range   Sodium 136 135 - 145 mmol/L   Potassium 4.2 3.5 - 5.1 mmol/L   Chloride 103 98 - 111 mmol/L   CO2 25 22 - 32 mmol/L   Glucose, Bld 118 (H) 70 - 99 mg/dL   BUN 15 8 - 23 mg/dL   Creatinine, Ser 0.83 0.61 - 1.24 mg/dL   Calcium 8.7 (L) 8.9 - 10.3 mg/dL   GFR, Estimated >60 >60 mL/min   Anion gap 8 5 - 15    Discharge Medications:   Allergies as of 08/05/2022       Reactions   Codeine Hives        Medication List     STOP taking these medications    diclofenac Sodium 1 % Gel Commonly known as: VOLTAREN   naproxen sodium 220 MG tablet Commonly known as: ALEVE   predniSONE 10 MG (48) Tbpk tablet Commonly  known as: STERAPRED UNI-PAK 48 TAB   Testosterone 1.62 % Gel       TAKE these medications    acetaminophen 325 MG tablet Commonly known as: TYLENOL Take 1-2 tablets (325-650 mg total) by mouth every 6 (six) hours as needed for mild pain (pain score 1-3 or temp > 100.5). What changed:  medication strength how much to take reasons to take this   aspirin 81 MG chewable tablet Chew 1 tablet (81 mg total) by mouth 2 (two) times daily.   celecoxib 100 MG capsule Commonly known as: CELEBREX Take 1 capsule (100 mg total) by mouth 2 (two) times daily.   cholecalciferol 25 MCG (1000 UNIT) tablet Commonly known as: VITAMIN D3 Take 1,000 Units by mouth daily.   docusate sodium 100 MG capsule Commonly known as: COLACE Take 1 capsule (100 mg total) by mouth 2 (two) times daily.   famciclovir 250 MG tablet Commonly known as: FAMVIR Take 1 tablet (250  mg total) by mouth 2 (two) times daily for suppression What changed: when to take this   famciclovir 250 MG tablet Commonly known as: FAMVIR Take 1 tablet (250 mg total) by mouth 2 (two) times daily for suppression What changed: Another medication with the same name was changed. Make sure you understand how and when to take each.   Melatonin 10 MG Caps Take 10 mg by mouth at bedtime as needed (sleep).   methocarbamol 500 MG tablet Commonly known as: ROBAXIN Take 1 tablet (500 mg total) by mouth every 6 (six) hours as needed for muscle spasms. What changed:  when to take this reasons to take this   oxyCODONE 5 MG immediate release tablet Commonly known as: Oxy IR/ROXICODONE Take 1-2 tablets (5-10 mg total) by mouth every 4 (four) hours as needed for moderate pain (pain score 4-6).   simvastatin 40 MG tablet Commonly known as: ZOCOR Take 1 tablet (40 mg total) by mouth daily.        Diagnostic Studies: VAS [place] LOWER EXTREMITY VENOUS (DVT)  Result Date: 08/05/2022  Lower Venous DVT Study Patient Name:  [name]  Date of Exam:   08/05/2022 Medical Rec #: [id number]       Accession #:    [id number] Date of Birth: 01-02-1951       Patient Gender: M Patient Age:   30 years Exam [facility] Procedure:      VAS [place] LOWER EXTREMITY VENOUS (DVT) Referring Phys: Marcene Duos --------------------------------------------------------------------------------  Indications: Swelling, and Edema.  Comparison Study: no prior Performing Technologist: Archie Patten RVS  Examination Guidelines: A complete evaluation includes B-mode imaging, spectral Doppler, color Doppler, and power Doppler as needed of all accessible portions of each vessel. Bilateral testing is considered an integral part of a complete examination. Limited examinations for reoccurring indications may be performed as noted. The reflux portion of the exam is performed with the patient in reverse Trendelenburg.   +-----+---------------+---------+-----------+----------+--------------+ RIGHTCompressibilityPhasicitySpontaneityPropertiesThrombus Aging +-----+---------------+---------+-----------+----------+--------------+ CFV  Full           Yes      Yes                                 +-----+---------------+---------+-----------+----------+--------------+   +---------+---------------+---------+-----------+----------+--------------+ LEFT     CompressibilityPhasicitySpontaneityPropertiesThrombus Aging +---------+---------------+---------+-----------+----------+--------------+ CFV      Full           Yes      Yes                                 +---------+---------------+---------+-----------+----------+--------------+  SFJ      Full                                                        +---------+---------------+---------+-----------+----------+--------------+ FV Prox  Full                                                        +---------+---------------+---------+-----------+----------+--------------+ FV Mid   Full                                                        +---------+---------------+---------+-----------+----------+--------------+ FV DistalFull                                                        +---------+---------------+---------+-----------+----------+--------------+ PFV      Full                                                        +---------+---------------+---------+-----------+----------+--------------+ POP      Full           Yes      Yes                                 +---------+---------------+---------+-----------+----------+--------------+ PTV      Full                                                        +---------+---------------+---------+-----------+----------+--------------+ PERO     Full                                                         +---------+---------------+---------+-----------+----------+--------------+     Summary: RIGHT: - No evidence of common femoral vein obstruction.  LEFT: - There is no evidence of deep vein thrombosis in the lower extremity.  - No cystic structure found in the popliteal fossa.  *See table(s) above for measurements and observations. Electronically signed by Harold Barban MD on 08/05/2022 at 10:34:28 PM.    Final    DG MINI C-ARM IMAGE ONLY  Result Date: 08/04/2022 There is no interpretation for this exam.  This order is for images obtained during a surgical procedure.  Please See "Surgeries" Tab for more information regarding the procedure.    Disposition: Discharge disposition: 01-Home or Self  Care       Discharge Instructions     Call MD / Call 911   Complete by: As directed    If you experience chest pain or shortness of breath, CALL 911 and be transported to the hospital emergency room.  If you develope a fever above 101 F, pus (white drainage) or increased drainage or redness at the wound, or calf pain, call your surgeon's office.   Constipation Prevention   Complete by: As directed    Drink plenty of fluids.  Prune juice may be helpful.  You may use a stool softener, such as Colace (over the counter) 100 mg twice a day.  Use MiraLax (over the counter) for constipation as needed.   Diet - low sodium heart healthy   Complete by: As directed    Discharge instructions   Complete by: As directed    CPM use 1 hour 3 times a day increasing the degrees daily Okay to shower dressing is waterproof Weightbearing as tolerated with walker/crutches and use the knee immobilizer for the first week   Increase activity slowly as tolerated   Complete by: As directed    Post-operative opioid taper instructions:   Complete by: As directed    POST-OPERATIVE OPIOID TAPER INSTRUCTIONS: It is important to wean off of your opioid medication as soon as possible. If you do not need pain medication after your  surgery it is ok to stop day one. Opioids include: Codeine, Hydrocodone(Norco, Vicodin), Oxycodone(Percocet, oxycontin) and hydromorphone amongst others.  Long term and even short term use of opiods can cause: Increased pain response Dependence Constipation Depression Respiratory depression And more.  Withdrawal symptoms can include Flu like symptoms Nausea, vomiting And more Techniques to manage these symptoms Hydrate well Eat regular healthy meals Stay active Use relaxation techniques(deep breathing, meditating, yoga) Do Not substitute Alcohol to help with tapering If you have been on opioids for less than two weeks and do not have pain than it is ok to stop all together.  Plan to wean off of opioids This plan should start within one week post op of your joint replacement. Maintain the same interval or time between taking each dose and first decrease the dose.  Cut the total daily intake of opioids by one tablet each day Next start to increase the time between doses. The last dose that should be eliminated is the evening dose.           Follow-up Information     Practice, [facility] Family Follow up.   Contact information: [place] [address] [place], [place] Follow up.   Specialty: [place] Why: home health services will be provided by [facility], start of care within 48 hours pos discharge Contact information: [address] [place] [address] [phone]         Meredith Pel, MD Follow up.   Specialty: Orthopedic Surgery Why: Follow-up in 2 weeks Contact information: [place] [address] [phone]                  Signed: Anderson Malta 08/06/2022, 4:55 PM

## 2022-08-08 DIAGNOSIS — E785 Hyperlipidemia, unspecified: Secondary | ICD-10-CM | POA: Diagnosis not present

## 2022-08-08 DIAGNOSIS — Z7982 Long term (current) use of aspirin: Secondary | ICD-10-CM | POA: Diagnosis not present

## 2022-08-08 DIAGNOSIS — Z96652 Presence of left artificial knee joint: Secondary | ICD-10-CM | POA: Diagnosis not present

## 2022-08-08 DIAGNOSIS — Z8701 Personal history of pneumonia (recurrent): Secondary | ICD-10-CM | POA: Diagnosis not present

## 2022-08-08 DIAGNOSIS — Z471 Aftercare following joint replacement surgery: Secondary | ICD-10-CM | POA: Diagnosis not present

## 2022-08-08 DIAGNOSIS — Z85038 Personal history of other malignant neoplasm of large intestine: Secondary | ICD-10-CM | POA: Diagnosis not present

## 2022-08-08 DIAGNOSIS — K219 Gastro-esophageal reflux disease without esophagitis: Secondary | ICD-10-CM | POA: Diagnosis not present

## 2022-08-09 DIAGNOSIS — Z969 Presence of functional implant, unspecified: Secondary | ICD-10-CM

## 2022-08-10 ENCOUNTER — Other Ambulatory Visit: Payer: Self-pay | Admitting: Orthopedic Surgery

## 2022-08-10 ENCOUNTER — Other Ambulatory Visit (HOSPITAL_COMMUNITY): Payer: Self-pay

## 2022-08-10 DIAGNOSIS — E785 Hyperlipidemia, unspecified: Secondary | ICD-10-CM | POA: Diagnosis not present

## 2022-08-10 DIAGNOSIS — Z471 Aftercare following joint replacement surgery: Secondary | ICD-10-CM | POA: Diagnosis not present

## 2022-08-10 DIAGNOSIS — Z96652 Presence of left artificial knee joint: Secondary | ICD-10-CM | POA: Diagnosis not present

## 2022-08-10 DIAGNOSIS — Z8701 Personal history of pneumonia (recurrent): Secondary | ICD-10-CM | POA: Diagnosis not present

## 2022-08-10 DIAGNOSIS — K219 Gastro-esophageal reflux disease without esophagitis: Secondary | ICD-10-CM | POA: Diagnosis not present

## 2022-08-10 DIAGNOSIS — Z7982 Long term (current) use of aspirin: Secondary | ICD-10-CM | POA: Diagnosis not present

## 2022-08-10 DIAGNOSIS — Z85038 Personal history of other malignant neoplasm of large intestine: Secondary | ICD-10-CM | POA: Diagnosis not present

## 2022-08-10 MED ORDER — OXYCODONE HCL 5 MG PO TABS
5.0000 mg | ORAL_TABLET | Freq: Three times a day (TID) | ORAL | 0 refills | Status: DC | PRN
  Filled 2022-08-10: qty 30, 5d supply, fill #0

## 2022-08-12 DIAGNOSIS — Z85038 Personal history of other malignant neoplasm of large intestine: Secondary | ICD-10-CM | POA: Diagnosis not present

## 2022-08-12 DIAGNOSIS — K219 Gastro-esophageal reflux disease without esophagitis: Secondary | ICD-10-CM | POA: Diagnosis not present

## 2022-08-12 DIAGNOSIS — E785 Hyperlipidemia, unspecified: Secondary | ICD-10-CM | POA: Diagnosis not present

## 2022-08-12 DIAGNOSIS — Z471 Aftercare following joint replacement surgery: Secondary | ICD-10-CM | POA: Diagnosis not present

## 2022-08-12 DIAGNOSIS — Z7982 Long term (current) use of aspirin: Secondary | ICD-10-CM | POA: Diagnosis not present

## 2022-08-12 DIAGNOSIS — Z8701 Personal history of pneumonia (recurrent): Secondary | ICD-10-CM | POA: Diagnosis not present

## 2022-08-12 DIAGNOSIS — Z96652 Presence of left artificial knee joint: Secondary | ICD-10-CM | POA: Diagnosis not present

## 2022-08-14 DIAGNOSIS — Z7982 Long term (current) use of aspirin: Secondary | ICD-10-CM | POA: Diagnosis not present

## 2022-08-14 DIAGNOSIS — Z96652 Presence of left artificial knee joint: Secondary | ICD-10-CM | POA: Diagnosis not present

## 2022-08-14 DIAGNOSIS — Z85038 Personal history of other malignant neoplasm of large intestine: Secondary | ICD-10-CM | POA: Diagnosis not present

## 2022-08-14 DIAGNOSIS — Z471 Aftercare following joint replacement surgery: Secondary | ICD-10-CM | POA: Diagnosis not present

## 2022-08-14 DIAGNOSIS — E785 Hyperlipidemia, unspecified: Secondary | ICD-10-CM | POA: Diagnosis not present

## 2022-08-14 DIAGNOSIS — Z8701 Personal history of pneumonia (recurrent): Secondary | ICD-10-CM | POA: Diagnosis not present

## 2022-08-14 DIAGNOSIS — K219 Gastro-esophageal reflux disease without esophagitis: Secondary | ICD-10-CM | POA: Diagnosis not present

## 2022-08-18 ENCOUNTER — Other Ambulatory Visit (HOSPITAL_COMMUNITY): Payer: Self-pay

## 2022-08-18 DIAGNOSIS — K219 Gastro-esophageal reflux disease without esophagitis: Secondary | ICD-10-CM | POA: Diagnosis not present

## 2022-08-18 DIAGNOSIS — Z8701 Personal history of pneumonia (recurrent): Secondary | ICD-10-CM | POA: Diagnosis not present

## 2022-08-18 DIAGNOSIS — E785 Hyperlipidemia, unspecified: Secondary | ICD-10-CM | POA: Diagnosis not present

## 2022-08-18 DIAGNOSIS — Z85038 Personal history of other malignant neoplasm of large intestine: Secondary | ICD-10-CM | POA: Diagnosis not present

## 2022-08-18 DIAGNOSIS — Z96652 Presence of left artificial knee joint: Secondary | ICD-10-CM | POA: Diagnosis not present

## 2022-08-18 DIAGNOSIS — Z7982 Long term (current) use of aspirin: Secondary | ICD-10-CM | POA: Diagnosis not present

## 2022-08-18 DIAGNOSIS — Z471 Aftercare following joint replacement surgery: Secondary | ICD-10-CM | POA: Diagnosis not present

## 2022-08-19 ENCOUNTER — Ambulatory Visit (INDEPENDENT_AMBULATORY_CARE_PROVIDER_SITE_OTHER): Payer: 59 | Admitting: Orthopedic Surgery

## 2022-08-19 ENCOUNTER — Other Ambulatory Visit (HOSPITAL_COMMUNITY): Payer: Self-pay

## 2022-08-19 ENCOUNTER — Ambulatory Visit (INDEPENDENT_AMBULATORY_CARE_PROVIDER_SITE_OTHER): Payer: 59

## 2022-08-19 DIAGNOSIS — Z96652 Presence of left artificial knee joint: Secondary | ICD-10-CM

## 2022-08-19 MED ORDER — TESTOSTERONE 20.25 MG/ACT (1.62%) TD GEL
2.0000 | Freq: Every day | TRANSDERMAL | 5 refills | Status: DC
  Filled 2022-08-19: qty 75, 30d supply, fill #0
  Filled 2022-09-16: qty 75, 30d supply, fill #1
  Filled 2022-10-14: qty 75, 30d supply, fill #2
  Filled 2022-11-13: qty 75, 30d supply, fill #3
  Filled 2022-12-16: qty 75, 30d supply, fill #4
  Filled 2023-01-13: qty 75, 30d supply, fill #5

## 2022-08-20 ENCOUNTER — Encounter: Payer: Self-pay | Admitting: Orthopedic Surgery

## 2022-08-20 DIAGNOSIS — K219 Gastro-esophageal reflux disease without esophagitis: Secondary | ICD-10-CM | POA: Diagnosis not present

## 2022-08-20 DIAGNOSIS — E785 Hyperlipidemia, unspecified: Secondary | ICD-10-CM | POA: Diagnosis not present

## 2022-08-20 DIAGNOSIS — Z7982 Long term (current) use of aspirin: Secondary | ICD-10-CM | POA: Diagnosis not present

## 2022-08-20 DIAGNOSIS — Z471 Aftercare following joint replacement surgery: Secondary | ICD-10-CM | POA: Diagnosis not present

## 2022-08-20 DIAGNOSIS — Z8701 Personal history of pneumonia (recurrent): Secondary | ICD-10-CM | POA: Diagnosis not present

## 2022-08-20 DIAGNOSIS — Z85038 Personal history of other malignant neoplasm of large intestine: Secondary | ICD-10-CM | POA: Diagnosis not present

## 2022-08-20 DIAGNOSIS — Z96652 Presence of left artificial knee joint: Secondary | ICD-10-CM | POA: Diagnosis not present

## 2022-08-20 NOTE — Progress Notes (Signed)
Post-Op Visit Note   Patient: [name]           Date of Birth: 1951/08/02           MRN: [id number] Visit Date: 08/19/2022 PCP: Practice, [place]:  Chief Complaint:  Chief Complaint  Patient presents with   Left Knee - Routine Post Op    08/04/22 left TKA   Visit Diagnoses:  1. S/P total knee arthroplasty, left     Plan: Tavaughn is a patient who is now 2 weeks out left total knee replacement.  Patient doing well.  He is at 110 with CPM machine and 95 with home health therapy.  Pain medicines discontinued several days ago.  He is able to get up and down steps.  Strength has been improving over the past several days.  He has been doing some work on his Therapist, art.  Using a cane for ambulation at times.  On examination the incision is well-healed.  He is able to do a straight leg raise with about 5 degrees extensor lag.  Pedal pulses palpable.  No calf tenderness.  Range of motion is 0-95 cold.  Collaterals are stable to varus valgus stress at 0 and 30 degrees.  Radiographs look good.  Plan at this time is to continue with range of motion and strengthening exercises.  4-week return for clinical recheck.  Follow-Up Instructions: No follow-ups on file.   Orders:  Orders Placed This Encounter  Procedures   XR Knee 1-2 Views Left   No orders of the defined types were placed in this encounter.   Imaging: No results found.  PMFS History: Patient Active Problem List   Diagnosis Date Noted   Retained orthopedic hardware    OA (osteoarthritis) of knee 08/04/2022   S/P knee replacement 08/04/2022   Infected cyst of skin 08/19/2012    Class: Chronic   Past Medical History:  Diagnosis Date   GERD (gastroesophageal reflux disease)    History of adenomatous polyp of colon    2004  hyperplastic and tubular adenoma/  2012 hyperplastic   History of aspiration pneumonitis    03/ 2015   HSV-1 (herpes simplex virus 1) infection    chronic  fever blister's   Hyperlipidemia    Phimosis    Snores    wears snore night gaurd /  per pt no sleep study   Wears glasses     Family History  Problem Relation Age of Onset   Colon cancer Neg Hx    Colon polyps Neg Hx     Past Surgical History:  Procedure Laterality Date   ARTHROSCOPIC REPAIR ACL Left 1992   CIRCUMCISION N/A 08/06/2016   Procedure: CIRCUMCISION ADULT;  Surgeon: Ardis Hughs, MD;  Location: [facility];  Service: Urology;  Laterality: N/A;   COLONOSCOPY  last one 05-29-2011   EAR CYST EXCISION  08/19/2012   Procedure: CYST REMOVAL;  Surgeon: Jerrell Belfast, MD;  Location: [place] [facility];  Service: ENT;  Laterality: Left;  excision of left  infra auricular cyst    INGUINAL HERNIA REPAIR Right 1980's   KNEE ARTHROSCOPY Left 04/22/2006   TOTAL KNEE ARTHROPLASTY Left 08/04/2022   Procedure: LEFT TOTAL KNEE ARTHROPLASTY, HARDWARE REMOVAL;  Surgeon: Meredith Pel, MD;  Location: [facility];  Service: Orthopedics;  Laterality: Left;   Social History   Occupational History   Not on file  Tobacco Use   Smoking status: Never  Smokeless tobacco: Never  Vaping Use   Vaping Use: Never used  Substance and Sexual Activity   Alcohol use: No   Drug use: No   Sexual activity: Yes

## 2022-08-27 DIAGNOSIS — Z96652 Presence of left artificial knee joint: Secondary | ICD-10-CM | POA: Diagnosis not present

## 2022-08-27 DIAGNOSIS — M1712 Unilateral primary osteoarthritis, left knee: Secondary | ICD-10-CM | POA: Diagnosis not present

## 2022-08-27 DIAGNOSIS — M25562 Pain in left knee: Secondary | ICD-10-CM | POA: Diagnosis not present

## 2022-08-31 DIAGNOSIS — M1712 Unilateral primary osteoarthritis, left knee: Secondary | ICD-10-CM | POA: Diagnosis not present

## 2022-08-31 DIAGNOSIS — M25562 Pain in left knee: Secondary | ICD-10-CM | POA: Diagnosis not present

## 2022-08-31 DIAGNOSIS — Z96652 Presence of left artificial knee joint: Secondary | ICD-10-CM | POA: Diagnosis not present

## 2022-09-02 DIAGNOSIS — M25562 Pain in left knee: Secondary | ICD-10-CM | POA: Diagnosis not present

## 2022-09-02 DIAGNOSIS — M1712 Unilateral primary osteoarthritis, left knee: Secondary | ICD-10-CM | POA: Diagnosis not present

## 2022-09-02 DIAGNOSIS — Z96652 Presence of left artificial knee joint: Secondary | ICD-10-CM | POA: Diagnosis not present

## 2022-09-07 DIAGNOSIS — M1712 Unilateral primary osteoarthritis, left knee: Secondary | ICD-10-CM | POA: Diagnosis not present

## 2022-09-07 DIAGNOSIS — Z96652 Presence of left artificial knee joint: Secondary | ICD-10-CM | POA: Diagnosis not present

## 2022-09-07 DIAGNOSIS — M25562 Pain in left knee: Secondary | ICD-10-CM | POA: Diagnosis not present

## 2022-09-09 ENCOUNTER — Other Ambulatory Visit: Payer: Self-pay | Admitting: Orthopedic Surgery

## 2022-09-09 ENCOUNTER — Other Ambulatory Visit (HOSPITAL_COMMUNITY): Payer: Self-pay

## 2022-09-09 DIAGNOSIS — Z96652 Presence of left artificial knee joint: Secondary | ICD-10-CM | POA: Diagnosis not present

## 2022-09-09 DIAGNOSIS — M1712 Unilateral primary osteoarthritis, left knee: Secondary | ICD-10-CM | POA: Diagnosis not present

## 2022-09-09 DIAGNOSIS — M25562 Pain in left knee: Secondary | ICD-10-CM | POA: Diagnosis not present

## 2022-09-09 MED ORDER — METHOCARBAMOL 500 MG PO TABS
500.0000 mg | ORAL_TABLET | Freq: Four times a day (QID) | ORAL | 0 refills | Status: DC | PRN
  Filled 2022-09-09: qty 30, 8d supply, fill #0

## 2022-09-14 DIAGNOSIS — M1712 Unilateral primary osteoarthritis, left knee: Secondary | ICD-10-CM | POA: Diagnosis not present

## 2022-09-14 DIAGNOSIS — M25562 Pain in left knee: Secondary | ICD-10-CM | POA: Diagnosis not present

## 2022-09-14 DIAGNOSIS — Z96652 Presence of left artificial knee joint: Secondary | ICD-10-CM | POA: Diagnosis not present

## 2022-09-16 ENCOUNTER — Ambulatory Visit (INDEPENDENT_AMBULATORY_CARE_PROVIDER_SITE_OTHER): Payer: 59 | Admitting: Orthopedic Surgery

## 2022-09-16 ENCOUNTER — Other Ambulatory Visit (HOSPITAL_COMMUNITY): Payer: Self-pay

## 2022-09-16 DIAGNOSIS — M25562 Pain in left knee: Secondary | ICD-10-CM | POA: Diagnosis not present

## 2022-09-16 DIAGNOSIS — M1712 Unilateral primary osteoarthritis, left knee: Secondary | ICD-10-CM | POA: Diagnosis not present

## 2022-09-16 DIAGNOSIS — Z96652 Presence of left artificial knee joint: Secondary | ICD-10-CM | POA: Diagnosis not present

## 2022-09-17 ENCOUNTER — Encounter: Payer: Self-pay | Admitting: Orthopedic Surgery

## 2022-09-17 ENCOUNTER — Telehealth: Payer: Self-pay

## 2022-09-17 DIAGNOSIS — Z96652 Presence of left artificial knee joint: Secondary | ICD-10-CM

## 2022-09-17 NOTE — Progress Notes (Signed)
   Post-Op Visit Note   Patient: [name]           Date of Birth: 07/08/1951           MRN: [id number] Visit Date: 09/16/2022 PCP: [facility]   Assessment & Plan:  Chief Complaint:  Chief Complaint  Patient presents with   Left Knee - Pain   Visit Diagnoses: No diagnosis found.  Plan: [name] is a patient is now 6 weeks out left total knee replacement.  Range of motion 0-1 10.  Aleve and Tylenol as needed for pain.  Doing physical therapy in [place].  Has achiness in the knee at times.  Takes Robaxin occasionally.  Going to [facility] for the next 4 weeks.  [facility] therapy prescription provided.  Plan is to continue to work on achieving more flexion so that he can ride the bike comfortably.  Not as worried about strength which will come.  Minimal effusion in the knee today and patella mobility is good.  No calf tenderness negative Homans today.  6-week return for final check.  Follow-Up Instructions: No follow-ups on file.   Orders:  No orders of the defined types were placed in this encounter.  No orders of the defined types were placed in this encounter.   Imaging: No results found.  PMFS History: Patient Active Problem List   Diagnosis Date Noted   Retained orthopedic hardware    OA (osteoarthritis) of knee 08/04/2022   S/P knee replacement 08/04/2022   Infected cyst of skin 08/19/2012    Class: Chronic   Past Medical History:  Diagnosis Date   GERD (gastroesophageal reflux disease)    History of adenomatous polyp of colon    2004  hyperplastic and tubular adenoma/  2012 hyperplastic   History of aspiration pneumonitis    03/ 2015   HSV-1 (herpes simplex virus 1) infection    chronic fever blister's   Hyperlipidemia    Phimosis    Snores    wears snore night gaurd /  per pt no sleep study   Wears glasses     Family History  Problem Relation Age of Onset   Colon cancer Neg Hx    Colon polyps Neg Hx     Past Surgical  History:  Procedure Laterality Date   ARTHROSCOPIC REPAIR ACL Left 1992   CIRCUMCISION N/A 08/06/2016   Procedure: CIRCUMCISION ADULT;  Surgeon: Ardis Hughs, MD;  Location: [facility];  Service: Urology;  Laterality: N/A;   COLONOSCOPY  last one 05-29-2011   EAR CYST EXCISION  08/19/2012   Procedure: CYST REMOVAL;  Surgeon: Jerrell Belfast, MD;  Location: [place];  Service: ENT;  Laterality: Left;  excision of left  infra auricular cyst    INGUINAL HERNIA REPAIR Right 1980's   KNEE ARTHROSCOPY Left 04/22/2006   TOTAL KNEE ARTHROPLASTY Left 08/04/2022   Procedure: LEFT TOTAL KNEE ARTHROPLASTY, HARDWARE REMOVAL;  Surgeon: Meredith Pel, MD;  Location: [place];  Service: Orthopedics;  Laterality: Left;   Social History   Occupational History   Not on file  Tobacco Use   Smoking status: Never   Smokeless tobacco: Never  Vaping Use   Vaping Use: Never used  Substance and Sexual Activity   Alcohol use: No   Drug use: No   Sexual activity: Yes

## 2022-09-17 NOTE — Telephone Encounter (Signed)
Faxed referral and demographics to Layla at [facility] at [phone].  CB# [phone].

## 2022-09-21 DIAGNOSIS — M1712 Unilateral primary osteoarthritis, left knee: Secondary | ICD-10-CM | POA: Diagnosis not present

## 2022-09-21 DIAGNOSIS — Z96652 Presence of left artificial knee joint: Secondary | ICD-10-CM | POA: Diagnosis not present

## 2022-09-21 DIAGNOSIS — M25562 Pain in left knee: Secondary | ICD-10-CM | POA: Diagnosis not present

## 2022-09-23 DIAGNOSIS — Z96652 Presence of left artificial knee joint: Secondary | ICD-10-CM | POA: Diagnosis not present

## 2022-09-23 DIAGNOSIS — M25562 Pain in left knee: Secondary | ICD-10-CM | POA: Diagnosis not present

## 2022-09-23 DIAGNOSIS — M1712 Unilateral primary osteoarthritis, left knee: Secondary | ICD-10-CM | POA: Diagnosis not present

## 2022-09-30 DIAGNOSIS — M6281 Muscle weakness (generalized): Secondary | ICD-10-CM | POA: Diagnosis not present

## 2022-09-30 DIAGNOSIS — Z96652 Presence of left artificial knee joint: Secondary | ICD-10-CM | POA: Diagnosis not present

## 2022-10-05 DIAGNOSIS — M6281 Muscle weakness (generalized): Secondary | ICD-10-CM | POA: Diagnosis not present

## 2022-10-05 DIAGNOSIS — Z96652 Presence of left artificial knee joint: Secondary | ICD-10-CM | POA: Diagnosis not present

## 2022-10-07 DIAGNOSIS — Z96652 Presence of left artificial knee joint: Secondary | ICD-10-CM | POA: Diagnosis not present

## 2022-10-07 DIAGNOSIS — M6281 Muscle weakness (generalized): Secondary | ICD-10-CM | POA: Diagnosis not present

## 2022-10-09 DIAGNOSIS — M6281 Muscle weakness (generalized): Secondary | ICD-10-CM | POA: Diagnosis not present

## 2022-10-12 DIAGNOSIS — M6281 Muscle weakness (generalized): Secondary | ICD-10-CM | POA: Diagnosis not present

## 2022-10-12 DIAGNOSIS — Z96652 Presence of left artificial knee joint: Secondary | ICD-10-CM | POA: Diagnosis not present

## 2022-10-14 DIAGNOSIS — M6281 Muscle weakness (generalized): Secondary | ICD-10-CM | POA: Diagnosis not present

## 2022-10-14 DIAGNOSIS — Z96652 Presence of left artificial knee joint: Secondary | ICD-10-CM | POA: Diagnosis not present

## 2022-10-15 ENCOUNTER — Other Ambulatory Visit: Payer: Self-pay

## 2022-10-19 DIAGNOSIS — M6281 Muscle weakness (generalized): Secondary | ICD-10-CM | POA: Diagnosis not present

## 2022-10-19 DIAGNOSIS — Z96652 Presence of left artificial knee joint: Secondary | ICD-10-CM | POA: Diagnosis not present

## 2022-10-21 DIAGNOSIS — Z96652 Presence of left artificial knee joint: Secondary | ICD-10-CM | POA: Diagnosis not present

## 2022-10-21 DIAGNOSIS — M6281 Muscle weakness (generalized): Secondary | ICD-10-CM | POA: Diagnosis not present

## 2022-10-23 DIAGNOSIS — Z96652 Presence of left artificial knee joint: Secondary | ICD-10-CM | POA: Diagnosis not present

## 2022-10-23 DIAGNOSIS — M6281 Muscle weakness (generalized): Secondary | ICD-10-CM | POA: Diagnosis not present

## 2022-11-04 ENCOUNTER — Ambulatory Visit (INDEPENDENT_AMBULATORY_CARE_PROVIDER_SITE_OTHER): Payer: Commercial Managed Care - PPO | Admitting: Orthopedic Surgery

## 2022-11-04 ENCOUNTER — Encounter: Payer: Self-pay | Admitting: Orthopedic Surgery

## 2022-11-04 DIAGNOSIS — Z96652 Presence of left artificial knee joint: Secondary | ICD-10-CM

## 2022-11-04 NOTE — Progress Notes (Signed)
Post-Op Visit Note   Patient: [name]           Date of Birth: [birth year]           MRN: [id number] Visit Date: 11/04/2022 PCP: [facility], [place]:  Chief Complaint:  Chief Complaint  Patient presents with   Left Knee - Follow-up    08/04/22 (2m Left Total Knee Arthroplasty, Hardware Removal      Visit Diagnoses:  1. S/P total knee arthroplasty, left     Plan: VBenzionis a 72year old patient who underwent left total knee replacement 08/04/2022.  Doing well.  Finished physical therapy the last week of December [place]  He is wearing a knee sleeve.  Overall VAloysuishas an excellent plan to alternate rest and exercise to achieve range of motion and strength.  He is doing heating prior to stretching and he is also doing weight training in such a way that it also stretches out to improve his flexion.  On examination he has range of motion of 0-1 10.  Collaterals are stable.  Patella mobility increasing.  Does take Aleve about 3/day.  Also was doing some swimming.  Plan at this time is we discussed his exercise regimen.  He will come back in 3 months for final check.  Follow-Up Instructions: No follow-ups on file.   Orders:  No orders of the defined types were placed in this encounter.  No orders of the defined types were placed in this encounter.   Imaging: No results found.  PMFS History: Patient Active Problem List   Diagnosis Date Noted   Retained orthopedic hardware    OA (osteoarthritis) of knee 08/04/2022   S/P knee replacement 08/04/2022   Infected cyst of skin 08/19/2012    Class: Chronic   Past Medical History:  Diagnosis Date   GERD (gastroesophageal reflux disease)    History of adenomatous polyp of colon    2004  hyperplastic and tubular adenoma/  2012 hyperplastic   History of aspiration pneumonitis    03/ 2015   HSV-1 (herpes simplex virus 1) infection    chronic fever blister's    Hyperlipidemia    Phimosis    Snores    wears snore night gaurd /  per pt no sleep study   Wears glasses     Family History  Problem Relation Age of Onset   Colon cancer Neg Hx    Colon polyps Neg Hx     Past Surgical History:  Procedure Laterality Date   ARTHROSCOPIC REPAIR ACL Left 1992   CIRCUMCISION N/A 08/06/2016   Procedure: CIRCUMCISION ADULT;  Surgeon: BArdis Hughs MD;  [facility]  Service: Urology;  Laterality: N/A;   COLONOSCOPY  last one 05-29-2011   EAR CYST EXCISION  08/19/2012   Procedure: CYST REMOVAL;  Surgeon: DJerrell Belfast MD;  [place]  Service: ENT;  Laterality: Left;  excision of left  infra auricular cyst    INGUINAL HERNIA REPAIR Right 1980's   KNEE ARTHROSCOPY Left 04/22/2006   TOTAL KNEE ARTHROPLASTY Left 08/04/2022   Procedure: LEFT TOTAL KNEE ARTHROPLASTY, HARDWARE REMOVAL;  Surgeon: DMeredith Pel MD;  [place]  Service: Orthopedics;  Laterality: Left;   Social History   Occupational History   Not on file  Tobacco Use   Smoking status: Never   Smokeless tobacco: Never  Vaping Use   Vaping Use: Never  used  Substance and Sexual Activity   Alcohol use: No   Drug use: No   Sexual activity: Yes

## 2022-11-09 ENCOUNTER — Other Ambulatory Visit: Payer: Self-pay

## 2022-11-09 ENCOUNTER — Other Ambulatory Visit (HOSPITAL_COMMUNITY): Payer: Self-pay

## 2022-11-11 DIAGNOSIS — L508 Other urticaria: Secondary | ICD-10-CM | POA: Diagnosis not present

## 2022-11-13 ENCOUNTER — Other Ambulatory Visit (HOSPITAL_COMMUNITY): Payer: Self-pay

## 2022-12-16 ENCOUNTER — Other Ambulatory Visit (HOSPITAL_COMMUNITY): Payer: Self-pay

## 2023-01-13 ENCOUNTER — Other Ambulatory Visit (HOSPITAL_COMMUNITY): Payer: Self-pay

## 2023-01-19 DIAGNOSIS — Z125 Encounter for screening for malignant neoplasm of prostate: Secondary | ICD-10-CM | POA: Diagnosis not present

## 2023-01-19 DIAGNOSIS — E349 Endocrine disorder, unspecified: Secondary | ICD-10-CM | POA: Diagnosis not present

## 2023-01-19 DIAGNOSIS — Z79899 Other long term (current) drug therapy: Secondary | ICD-10-CM | POA: Diagnosis not present

## 2023-01-19 DIAGNOSIS — E7800 Pure hypercholesterolemia, unspecified: Secondary | ICD-10-CM | POA: Diagnosis not present

## 2023-01-22 DIAGNOSIS — E349 Endocrine disorder, unspecified: Secondary | ICD-10-CM | POA: Diagnosis not present

## 2023-01-22 DIAGNOSIS — Z91018 Allergy to other foods: Secondary | ICD-10-CM | POA: Diagnosis not present

## 2023-01-22 DIAGNOSIS — H9391 Unspecified disorder of right ear: Secondary | ICD-10-CM | POA: Diagnosis not present

## 2023-01-22 DIAGNOSIS — E7800 Pure hypercholesterolemia, unspecified: Secondary | ICD-10-CM | POA: Diagnosis not present

## 2023-01-22 DIAGNOSIS — A6000 Herpesviral infection of urogenital system, unspecified: Secondary | ICD-10-CM | POA: Diagnosis not present

## 2023-01-22 DIAGNOSIS — M170 Bilateral primary osteoarthritis of knee: Secondary | ICD-10-CM | POA: Diagnosis not present

## 2023-01-22 DIAGNOSIS — Z139 Encounter for screening, unspecified: Secondary | ICD-10-CM | POA: Diagnosis not present

## 2023-02-03 ENCOUNTER — Ambulatory Visit (INDEPENDENT_AMBULATORY_CARE_PROVIDER_SITE_OTHER): Payer: Medicare Other | Admitting: Orthopedic Surgery

## 2023-02-03 DIAGNOSIS — Z96652 Presence of left artificial knee joint: Secondary | ICD-10-CM

## 2023-02-04 ENCOUNTER — Other Ambulatory Visit (HOSPITAL_COMMUNITY): Payer: Self-pay

## 2023-02-04 NOTE — Progress Notes (Signed)
   Post-Op Visit Note   Patient: [name]           Date of Birth: 27-Apr-1951           MRN: [id number] Visit Date: 02/03/2023 PCP: [facility]   Assessment & Plan:  Chief Complaint:  Chief Complaint  Patient presents with   Other     Recheck knee   Visit Diagnoses:  1. S/P total knee arthroplasty, left     Plan: [name] is now 6 months out left total knee replacement.  Doing well.  Plays pickle ball 5 days a week.  He is able to bike after he warms up the knee for about 1 to 2 minutes.  He has been able to ride the bike 25 miles.  Very mild symptoms on the right.  On examination he has range of motion on the left of 0-1 15.  No effusion on the right knee.  Plan at this time is to continue leg strengthening exercises.  I think his right knee may or may not require intervention in the future.  He will let me know.  Follow-up as needed.  Follow-Up Instructions: No follow-ups on file.   Orders:  No orders of the defined types were placed in this encounter.  No orders of the defined types were placed in this encounter.   Imaging: No results found.  PMFS History: Patient Active Problem List   Diagnosis Date Noted   Retained orthopedic hardware    OA (osteoarthritis) of knee 08/04/2022   S/P knee replacement 08/04/2022   Infected cyst of skin 08/19/2012    Class: Chronic   Past Medical History:  Diagnosis Date   GERD (gastroesophageal reflux disease)    History of adenomatous polyp of colon    2004  hyperplastic and tubular adenoma/  2012 hyperplastic   History of aspiration pneumonitis    03/ 2015   HSV-1 (herpes simplex virus 1) infection    chronic fever blister's   Hyperlipidemia    Phimosis    Snores    wears snore night gaurd /  per pt no sleep study   Wears glasses     Family History  Problem Relation Age of Onset   Colon cancer Neg Hx    Colon polyps Neg Hx     Past Surgical History:  Procedure Laterality Date   ARTHROSCOPIC  REPAIR ACL Left 1992   CIRCUMCISION N/A 08/06/2016   Procedure: CIRCUMCISION ADULT;  Surgeon: Crist Fat, MD;  Location: [facility];  Service: Urology;  Laterality: N/A;   COLONOSCOPY  last one 05-29-2011   EAR CYST EXCISION  08/19/2012   Procedure: CYST REMOVAL;  Surgeon: Osborn Coho, MD;  Location: [facility];  Service: ENT;  Laterality: Left;  excision of left  infra auricular cyst    INGUINAL HERNIA REPAIR Right 1980's   KNEE ARTHROSCOPY Left 04/22/2006   TOTAL KNEE ARTHROPLASTY Left 08/04/2022   Procedure: LEFT TOTAL KNEE ARTHROPLASTY, HARDWARE REMOVAL;  Surgeon: Cammy Copa, MD;  Location[facility];  Service: Orthopedics;  Laterality: Left;   Social History   Occupational History   Not on file  Tobacco Use   Smoking status: Never   Smokeless tobacco: Never  Vaping Use   Vaping Use: Never used  Substance and Sexual Activity   Alcohol use: No   Drug use: No   Sexual activity: Yes

## 2023-02-06 ENCOUNTER — Encounter: Payer: Self-pay | Admitting: Orthopedic Surgery

## 2023-02-19 ENCOUNTER — Emergency Department (HOSPITAL_BASED_OUTPATIENT_CLINIC_OR_DEPARTMENT_OTHER)
Admission: EM | Admit: 2023-02-19 | Discharge: 2023-02-19 | Disposition: A | Discharge status: Home / Self Care | Payer: Medicare Other | Attending: Emergency Medicine | Admitting: Emergency Medicine

## 2023-02-19 ENCOUNTER — Emergency Department (HOSPITAL_BASED_OUTPATIENT_CLINIC_OR_DEPARTMENT_OTHER): Payer: Medicare Other | Admitting: Radiology

## 2023-02-19 ENCOUNTER — Other Ambulatory Visit: Payer: Self-pay

## 2023-02-19 ENCOUNTER — Encounter (HOSPITAL_BASED_OUTPATIENT_CLINIC_OR_DEPARTMENT_OTHER): Payer: Self-pay

## 2023-02-19 DIAGNOSIS — S2232XA Fracture of one rib, left side, initial encounter for closed fracture: Secondary | ICD-10-CM | POA: Insufficient documentation

## 2023-02-19 DIAGNOSIS — Z7982 Long term (current) use of aspirin: Secondary | ICD-10-CM | POA: Diagnosis not present

## 2023-02-19 DIAGNOSIS — S299XXA Unspecified injury of thorax, initial encounter: Secondary | ICD-10-CM | POA: Diagnosis present

## 2023-02-19 DIAGNOSIS — Y92828 Other wilderness area as the place of occurrence of the external cause: Secondary | ICD-10-CM | POA: Insufficient documentation

## 2023-02-19 NOTE — ED Notes (Signed)
Pt verbalized understanding of d/c instructions, meds, and followup care. Denies questions. VSS, no distress noted. Steady gait to exit with all belongings.  ?

## 2023-02-19 NOTE — Discharge Instructions (Addendum)
Tylenol for discomfort.  Return if any problems.   
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
## 2023-03-02 DIAGNOSIS — L578 Other skin changes due to chronic exposure to nonionizing radiation: Secondary | ICD-10-CM | POA: Diagnosis not present

## 2023-03-02 DIAGNOSIS — L821 Other seborrheic keratosis: Secondary | ICD-10-CM | POA: Diagnosis not present

## 2023-03-02 DIAGNOSIS — D485 Neoplasm of uncertain behavior of skin: Secondary | ICD-10-CM | POA: Diagnosis not present

## 2023-03-02 DIAGNOSIS — L570 Actinic keratosis: Secondary | ICD-10-CM | POA: Diagnosis not present

## 2023-03-12 DIAGNOSIS — H524 Presbyopia: Secondary | ICD-10-CM | POA: Diagnosis not present

## 2023-03-12 DIAGNOSIS — H53143 Visual discomfort, bilateral: Secondary | ICD-10-CM | POA: Diagnosis not present

## 2023-03-12 DIAGNOSIS — H2513 Age-related nuclear cataract, bilateral: Secondary | ICD-10-CM | POA: Diagnosis not present

## 2023-03-12 DIAGNOSIS — H52223 Regular astigmatism, bilateral: Secondary | ICD-10-CM | POA: Diagnosis not present

## 2023-03-12 DIAGNOSIS — H1789 Other corneal scars and opacities: Secondary | ICD-10-CM | POA: Diagnosis not present

## 2023-03-12 DIAGNOSIS — H5212 Myopia, left eye: Secondary | ICD-10-CM | POA: Diagnosis not present

## 2023-03-17 ENCOUNTER — Ambulatory Visit: Payer: Self-pay | Admitting: Orthopedic Surgery

## 2023-03-26 DIAGNOSIS — Z23 Encounter for immunization: Secondary | ICD-10-CM | POA: Diagnosis not present

## 2023-05-18 ENCOUNTER — Other Ambulatory Visit (HOSPITAL_COMMUNITY): Payer: Self-pay

## 2023-05-18 MED ORDER — TESTOSTERONE 1.62 % TD GEL
2.0000 | Freq: Every day | TRANSDERMAL | 2 refills | Status: AC
  Filled 2023-05-18: qty 75, 30d supply, fill #0

## 2023-05-19 ENCOUNTER — Other Ambulatory Visit (HOSPITAL_COMMUNITY): Payer: Self-pay

## 2023-06-10 ENCOUNTER — Other Ambulatory Visit: Payer: Self-pay

## 2023-06-10 ENCOUNTER — Emergency Department (HOSPITAL_BASED_OUTPATIENT_CLINIC_OR_DEPARTMENT_OTHER)
Admission: EM | Admit: 2023-06-10 | Discharge: 2023-06-11 | Disposition: A | Discharge status: Home / Self Care | Payer: Medicare Other | Attending: Emergency Medicine | Admitting: Emergency Medicine

## 2023-06-10 ENCOUNTER — Encounter (HOSPITAL_BASED_OUTPATIENT_CLINIC_OR_DEPARTMENT_OTHER): Payer: Self-pay | Admitting: Emergency Medicine

## 2023-06-10 DIAGNOSIS — J029 Acute pharyngitis, unspecified: Secondary | ICD-10-CM

## 2023-06-10 DIAGNOSIS — U071 COVID-19: Secondary | ICD-10-CM | POA: Diagnosis not present

## 2023-06-10 DIAGNOSIS — Z7982 Long term (current) use of aspirin: Secondary | ICD-10-CM | POA: Insufficient documentation

## 2023-06-10 DIAGNOSIS — Z96659 Presence of unspecified artificial knee joint: Secondary | ICD-10-CM | POA: Insufficient documentation

## 2023-06-10 LAB — SARS CORONAVIRUS 2 BY RT PCR: SARS Coronavirus 2 by RT PCR: POSITIVE — AB

## 2023-06-10 LAB — GROUP A STREP BY PCR: Group A Strep by PCR: NOT DETECTED

## 2023-06-10 NOTE — ED Triage Notes (Signed)
Pt presents for sore throat that started as nasal drainage 3 days ago.  Denies HA  Endorses fever at home that improved with tylenol at 9pm (1G), dry cough  H/o total knee in October

## 2023-06-11 DIAGNOSIS — U071 COVID-19: Secondary | ICD-10-CM | POA: Diagnosis not present

## 2023-06-11 MED ORDER — DEXAMETHASONE 4 MG PO TABS
10.0000 mg | ORAL_TABLET | Freq: Once | ORAL | Status: AC
  Administered 2023-06-11: 10 mg via ORAL
  Filled 2023-06-11: qty 3

## 2023-06-11 NOTE — Discharge Instructions (Signed)
Drink plenty of fluids.  You may take ibuprofen or naproxen as needed for fever or aching.  You may also take acetaminophen as needed for fever or aching.  Return to the emergency department if you develop difficulty breathing.

## 2023-06-11 NOTE — ED Provider Notes (Signed)
[place] EMERGENCY DEPARTMENT AT [facility] Provider Note   CSN: [id number] Arrival date & time: 06/10/23  2153     History  Chief Complaint  Patient presents with   Sore Throat    [name] is a 72 y.o. male.  The history is provided by the patient.  Sore Throat  He has history of hyperlipidemia and comes in with 3-day history rhinorrhea which is also progressed to a sore throat.  He has had low-grade fevers as high as 100.  He has also had some chills but no sweats.  There has been a slight cough which she feels is mainly related to postnasal drainage.  He denies any shortness of breath and denies any nausea or vomiting or diarrhea.  He denies any sick contacts.  He is mainly worried because he had a recent knee replacement and he did not want any infection to get into his knee.   Home Medications Prior to Admission medications   Medication Sig Start Date End Date Taking? Authorizing Provider  acetaminophen (TYLENOL) 325 MG tablet Take 1-2 tablets (325-650 mg total) by mouth every 6 (six) hours as needed for mild pain (pain score 1-3 or temp > 100.5). 08/05/22   Cammy Copa, MD  aspirin 81 MG chewable tablet Chew 1 tablet (81 mg total) by mouth 2 (two) times daily. 08/05/22   Cammy Copa, MD  celecoxib (CELEBREX) 100 MG capsule Take 1 capsule (100 mg total) by mouth 2 (two) times daily. 08/05/22   Cammy Copa, MD  cholecalciferol (VITAMIN D3) 25 MCG (1000 UNIT) tablet Take 1,000 Units by mouth daily.    [provider]  docusate sodium (COLACE) 100 MG capsule Take 1 capsule (100 mg total) by mouth 2 (two) times daily. 08/05/22   Cammy Copa, MD  famciclovir (FAMVIR) 250 MG tablet Take 1 tablet (250 mg total) by mouth 2 (two) times daily for suppression Patient taking differently: Take 250 mg by mouth daily. 08/28/21     famciclovir (FAMVIR) 250 MG tablet Take 1 tablet (250 mg total) by mouth 2 (two) times daily for suppression  02/27/22     Melatonin 10 MG CAPS Take 10 mg by mouth at bedtime as needed (sleep).    [provider]  methocarbamol (ROBAXIN) 500 MG tablet Take 1 tablet (500 mg total) by mouth every 6 (six) hours as needed for muscle spasms. 09/09/22   Cammy Copa, MD  oxyCODONE (OXY IR/ROXICODONE) 5 MG immediate release tablet Take 1-2 tablets (5-10 mg total) by mouth every 8 (eight) hours as needed for moderate pain (pain score 4-6). 08/10/22   Cammy Copa, MD  simvastatin (ZOCOR) 40 MG tablet Take 1 tablet (40 mg total) by mouth daily. Patient not taking: Reported on 07/22/2022 06/24/21     Testosterone 1.62 % GEL Apply 2 Pumps topically daily. 05/17/23     Testosterone 20.25 MG/ACT (1.62%) GEL Apply 2 Pumps topically onto the skin daily. 08/18/22         Allergies    Codeine    Review of Systems   Review of Systems  All other systems reviewed and are negative.   Physical Exam Updated Vital Signs BP (!) 144/95 (BP Location: Left Arm)   Pulse 83   Temp 98.8 F (37.1 C)   Resp (!) 21   Wt 104.3 kg   SpO2 95%   BMI 33.97 kg/m  Physical Exam Vitals and nursing note reviewed.   71 year  old male, resting comfortably and in no acute distress. Vital signs are significant for mildly elevated blood pressure and borderline elevated respiratory rate. Oxygen saturation is 95%, which is normal. Head is normocephalic and atraumatic. PERRLA, EOMI. Oropharynx is clear. Neck is nontender and supple without adenopathy or JVD. Lungs are clear without rales, wheezes, or rhonchi. Chest is nontender. Heart has regular rate and rhythm without murmur. Abdomen is soft, flat, nontender. Extremities have no cyanosis or edema, full range of motion is present. Skin is warm and dry without rash. Neurologic: Mental status is normal, cranial nerves are intact, moves all extremities equally.  ED Results / Procedures / Treatments   Labs (all labs ordered are listed, but only abnormal results are  displayed) Labs Reviewed  SARS CORONAVIRUS 2 BY RT PCR - Abnormal; Notable for the following components:      Result Value   SARS Coronavirus 2 by RT PCR POSITIVE (*)    All other components within normal limits  GROUP A STREP BY PCR   Procedures Procedures    Medications Ordered in ED Medications  dexamethasone (DECADRON) tablet 10 mg (has no administration in time range)    ED Course/ Medical Decision Making/ A&P                                 Medical Decision Making  Upper respiratory infection, consider streptococcal infection.  I have reviewed and interpreted his laboratory tests, and my interpretation is negative PCR for strep, positive PCR for COVID-19.  This apparently is a COVID-19 infection.  Patient's only risk factor for severe disease is age.  Through shared decision making, decision was made to not prescribe Paxlovid.  I have ordered a dose of dexamethasone for symptomatic relief and I have given him return precautions should any signs of more severe COVID infection appear.  Final Clinical Impression(s) / ED Diagnoses Final diagnoses:  COVID-19 virus infection  Sore throat    Rx / DC Orders ED Discharge Orders     None         Dione Booze, MD 06/11/23 [phone]

## 2023-06-25 DIAGNOSIS — J45990 Exercise induced bronchospasm: Secondary | ICD-10-CM | POA: Diagnosis not present

## 2023-07-26 DIAGNOSIS — E349 Endocrine disorder, unspecified: Secondary | ICD-10-CM | POA: Diagnosis not present

## 2023-07-26 DIAGNOSIS — E7800 Pure hypercholesterolemia, unspecified: Secondary | ICD-10-CM | POA: Diagnosis not present

## 2023-07-26 DIAGNOSIS — Z79899 Other long term (current) drug therapy: Secondary | ICD-10-CM | POA: Diagnosis not present

## 2023-07-26 DIAGNOSIS — Z23 Encounter for immunization: Secondary | ICD-10-CM | POA: Diagnosis not present

## 2023-07-28 DIAGNOSIS — Z139 Encounter for screening, unspecified: Secondary | ICD-10-CM | POA: Diagnosis not present

## 2023-07-28 DIAGNOSIS — A6000 Herpesviral infection of urogenital system, unspecified: Secondary | ICD-10-CM | POA: Diagnosis not present

## 2023-07-28 DIAGNOSIS — E7800 Pure hypercholesterolemia, unspecified: Secondary | ICD-10-CM | POA: Diagnosis not present

## 2023-07-28 DIAGNOSIS — E349 Endocrine disorder, unspecified: Secondary | ICD-10-CM | POA: Diagnosis not present

## 2023-07-28 DIAGNOSIS — Z91018 Allergy to other foods: Secondary | ICD-10-CM | POA: Diagnosis not present

## 2023-07-28 DIAGNOSIS — Z1331 Encounter for screening for depression: Secondary | ICD-10-CM | POA: Diagnosis not present

## 2023-08-23 ENCOUNTER — Ambulatory Visit (INDEPENDENT_AMBULATORY_CARE_PROVIDER_SITE_OTHER): Payer: Medicare Other | Admitting: Orthopedic Surgery

## 2023-08-23 ENCOUNTER — Telehealth: Payer: Self-pay

## 2023-08-23 ENCOUNTER — Encounter: Payer: Self-pay | Admitting: Orthopedic Surgery

## 2023-08-23 DIAGNOSIS — M1711 Unilateral primary osteoarthritis, right knee: Secondary | ICD-10-CM | POA: Diagnosis not present

## 2023-08-23 NOTE — Telephone Encounter (Signed)
Auth needed for right knee gel  

## 2023-08-23 NOTE — Progress Notes (Unsigned)
Office Visit Note   Patient: [name]           Date of Birth: [birth year]           MRN: [id number] Visit Date: 08/23/2023 Requested by: Practice, [facility] [address] [place],  [place] [address] PCP: Practice, [facility]  Subjective: Chief Complaint  Patient presents with   Right Knee - Pain    HPI: [name] is a 72 y.o. male who presents to the office reporting right knee pain of 4 to 5 months duration.  He is using a Tommy Copper sleeve.  Doing well with his left total knee replacement.  Occasionally wakes him from sleep at night.  He would like to have an injection in that right knee.  He is not looking for knee replacement anytime in the near future.  He does play pickle ball 2 to 3 hours a day 5 days a week.  He states that pickleball actually loosened up his left total knee replacement..                ROS: All systems reviewed are negative as they relate to the chief complaint within the history of present illness.  Patient denies fevers or chills.  Assessment & Plan: Visit Diagnoses:  1. Arthritis of right knee     Plan: Impression is right knee arthritis.  Aspiration and injection performed today.  I do not think he is really quite there yet for knee replacement.  Preapproved gel injection for when this cortisone shot wears off.  Continue with nonweightbearing quad strengthening exercises.  Follow-Up Instructions: No follow-ups on file.   Orders:  No orders of the defined types were placed in this encounter.  No orders of the defined types were placed in this encounter.     Procedures: Large Joint Inj: R knee on 08/23/2023 8:09 PM Indications: diagnostic evaluation, joint swelling and pain Details: 18 G 1.5 in needle, superolateral approach  Arthrogram: No  Medications: 5 mL lidocaine 1 %; 40 mg methylPREDNISolone acetate 40 MG/ML; 4 mL bupivacaine 0.25 % Outcome: tolerated well, no immediate complications Procedure,  treatment alternatives, risks and benefits explained, specific risks discussed. Consent was given by the patient. Immediately prior to procedure a time out was called to verify the correct patient, procedure, equipment, support staff and site/side marked as required. Patient was prepped and draped in the usual sterile fashion.       Clinical Data: No additional findings.  Objective: Vital Signs: There were no vitals taken for this visit.  Physical Exam:  Constitutional: Patient appears well-developed HEENT:  Head: Normocephalic Eyes:EOM are normal Neck: Normal range of motion Cardiovascular: Normal rate Pulmonary/chest: Effort normal Neurologic: Patient is alert Skin: Skin is warm Psychiatric: Patient has normal mood and affect  Ortho Exam: Ortho exam demonstrates range of motion on the right knee of 3-1 15.  Mild effusion present.  Medial greater than lateral joint line tenderness is present.  Pedal pulses palpable.  No masses lymphadenopathy or skin changes noted in that right knee region.  No groin pain with internal/external rotation of either leg.  Specialty Comments:  No specialty comments available.  Imaging: No results found.   PMFS History: Patient Active Problem List   Diagnosis Date Noted   Retained orthopedic hardware    OA (osteoarthritis) of knee 08/04/2022   S/P knee replacement 08/04/2022   Infected cyst of skin 08/19/2012    Class: Chronic   Past Medical  History:  Diagnosis Date   GERD (gastroesophageal reflux disease)    History of adenomatous polyp of colon    2004  hyperplastic and tubular adenoma/  2012 hyperplastic   History of aspiration pneumonitis    03/ 2015   HSV-1 (herpes simplex virus 1) infection    chronic fever blister's   Hyperlipidemia    Phimosis    Snores    wears snore night gaurd /  per pt no sleep study   Wears glasses     Family History  Problem Relation Age of Onset   Colon cancer Neg Hx    Colon polyps Neg Hx      Past Surgical History:  Procedure Laterality Date   ARTHROSCOPIC REPAIR ACL Left 1992   CIRCUMCISION N/A 08/06/2016   Procedure: CIRCUMCISION ADULT;  Surgeon: Crist Fat, MD;  Location[facility];  Service: Urology;  Laterality: N/A;   COLONOSCOPY  last one 05-29-2011   EAR CYST EXCISION  08/19/2012   Procedure: CYST REMOVAL;  Surgeon: Osborn Coho, MD;  Location: [facility];  Service: ENT;  Laterality: Left;  excision of left  infra auricular cyst    INGUINAL HERNIA REPAIR Right 1980's   KNEE ARTHROSCOPY Left 04/22/2006   TOTAL KNEE ARTHROPLASTY Left 08/04/2022   Procedure: LEFT TOTAL KNEE ARTHROPLASTY, HARDWARE REMOVAL;  Surgeon: Cammy Copa, MD;  Location: [facility] OR;  Service: Orthopedics;  Laterality: Left;   Social History   Occupational History   Not on file  Tobacco Use   Smoking status: Never   Smokeless tobacco: Never  Vaping Use   Vaping status: Never Used  Substance and Sexual Activity   Alcohol use: No   Drug use: No   Sexual activity: Yes

## 2023-08-24 MED ORDER — BUPIVACAINE HCL 0.25 % IJ SOLN
4.0000 mL | INTRAMUSCULAR | Status: AC | PRN
Start: 2023-08-23 — End: 2023-08-23
  Administered 2023-08-23: 4 mL via INTRA_ARTICULAR

## 2023-08-24 MED ORDER — METHYLPREDNISOLONE ACETATE 40 MG/ML IJ SUSP
40.0000 mg | INTRAMUSCULAR | Status: AC | PRN
Start: 2023-08-23 — End: 2023-08-23
  Administered 2023-08-23: 40 mg via INTRA_ARTICULAR

## 2023-08-24 MED ORDER — LIDOCAINE HCL 1 % IJ SOLN
5.0000 mL | INTRAMUSCULAR | Status: AC | PRN
Start: 2023-08-23 — End: 2023-08-23
  Administered 2023-08-23: 5 mL

## 2023-08-26 NOTE — Telephone Encounter (Signed)
VOB submitted for Monovisc, right knee  

## 2023-10-14 ENCOUNTER — Other Ambulatory Visit: Payer: Self-pay

## 2023-10-14 DIAGNOSIS — M1711 Unilateral primary osteoarthritis, right knee: Secondary | ICD-10-CM

## 2023-12-23 ENCOUNTER — Encounter (HOSPITAL_BASED_OUTPATIENT_CLINIC_OR_DEPARTMENT_OTHER): Payer: Self-pay

## 2023-12-23 ENCOUNTER — Emergency Department (HOSPITAL_BASED_OUTPATIENT_CLINIC_OR_DEPARTMENT_OTHER): Payer: Medicare Other

## 2023-12-23 ENCOUNTER — Other Ambulatory Visit: Payer: Self-pay

## 2023-12-23 DIAGNOSIS — N132 Hydronephrosis with renal and ureteral calculous obstruction: Secondary | ICD-10-CM | POA: Insufficient documentation

## 2023-12-23 DIAGNOSIS — R3911 Hesitancy of micturition: Secondary | ICD-10-CM | POA: Diagnosis not present

## 2023-12-23 DIAGNOSIS — R109 Unspecified abdominal pain: Secondary | ICD-10-CM | POA: Diagnosis present

## 2023-12-23 DIAGNOSIS — K429 Umbilical hernia without obstruction or gangrene: Secondary | ICD-10-CM | POA: Diagnosis not present

## 2023-12-23 DIAGNOSIS — N200 Calculus of kidney: Secondary | ICD-10-CM | POA: Diagnosis not present

## 2023-12-23 DIAGNOSIS — N281 Cyst of kidney, acquired: Secondary | ICD-10-CM | POA: Diagnosis not present

## 2023-12-23 DIAGNOSIS — Z7982 Long term (current) use of aspirin: Secondary | ICD-10-CM | POA: Insufficient documentation

## 2023-12-23 DIAGNOSIS — K5730 Diverticulosis of large intestine without perforation or abscess without bleeding: Secondary | ICD-10-CM | POA: Diagnosis not present

## 2023-12-23 LAB — CBC WITH DIFFERENTIAL/PLATELET
Abs Immature Granulocytes: 0.05 10*3/uL (ref 0.00–0.07)
Basophils Absolute: 0 10*3/uL (ref 0.0–0.1)
Basophils Relative: 1 %
Eosinophils Absolute: 0.3 10*3/uL (ref 0.0–0.5)
Eosinophils Relative: 3 %
HCT: 47.9 % (ref 39.0–52.0)
Hemoglobin: 16.7 g/dL (ref 13.0–17.0)
Immature Granulocytes: 1 %
Lymphocytes Relative: 32 %
Lymphs Abs: 2.5 10*3/uL (ref 0.7–4.0)
MCH: 33.1 pg (ref 26.0–34.0)
MCHC: 34.9 g/dL (ref 30.0–36.0)
MCV: 95 fL (ref 80.0–100.0)
Monocytes Absolute: 0.8 10*3/uL (ref 0.1–1.0)
Monocytes Relative: 10 %
Neutro Abs: 4.2 10*3/uL (ref 1.7–7.7)
Neutrophils Relative %: 53 %
Platelets: 210 10*3/uL (ref 150–400)
RBC: 5.04 MIL/uL (ref 4.22–5.81)
RDW: 12.3 % (ref 11.5–15.5)
WBC: 7.9 10*3/uL (ref 4.0–10.5)
nRBC: 0 % (ref 0.0–0.2)

## 2023-12-23 LAB — URINALYSIS, ROUTINE W REFLEX MICROSCOPIC
Bilirubin Urine: NEGATIVE
Glucose, UA: NEGATIVE mg/dL
Ketones, ur: NEGATIVE mg/dL
Leukocytes,Ua: NEGATIVE
Nitrite: NEGATIVE
Protein, ur: NEGATIVE mg/dL
RBC / HPF: >50 RBC/hpf (ref 0–5)
Specific Gravity, Urine: 1.026 (ref 1.005–1.030)
pH: 5.5 (ref 5.0–8.0)

## 2023-12-23 LAB — BASIC METABOLIC PANEL
Anion gap: 9 (ref 5–15)
BUN: 21 mg/dL (ref 8–23)
CO2: 26 mmol/L (ref 22–32)
Calcium: 10 mg/dL (ref 8.9–10.3)
Chloride: 103 mmol/L (ref 98–111)
Creatinine, Ser: 0.89 mg/dL (ref 0.61–1.24)
GFR, Estimated: >60 mL/min (ref >60)
Glucose, Bld: 103 mg/dL — ABNORMAL HIGH (ref 70–99)
Potassium: 4.4 mmol/L (ref 3.5–5.1)
Sodium: 138 mmol/L (ref 135–145)

## 2023-12-23 NOTE — ED Triage Notes (Signed)
Pt states that he has been having L flank that started about an hour ago and urgency to void for about a week, denies dysuria.

## 2023-12-24 ENCOUNTER — Emergency Department (HOSPITAL_BASED_OUTPATIENT_CLINIC_OR_DEPARTMENT_OTHER)
Admission: EM | Admit: 2023-12-24 | Discharge: 2023-12-24 | Disposition: A | Discharge status: Home / Self Care | Payer: Medicare Other | Attending: Emergency Medicine | Admitting: Emergency Medicine

## 2023-12-24 DIAGNOSIS — R3911 Hesitancy of micturition: Secondary | ICD-10-CM

## 2023-12-24 DIAGNOSIS — N200 Calculus of kidney: Secondary | ICD-10-CM

## 2023-12-24 DIAGNOSIS — N132 Hydronephrosis with renal and ureteral calculous obstruction: Secondary | ICD-10-CM | POA: Diagnosis not present

## 2023-12-24 MED ORDER — TAMSULOSIN HCL 0.4 MG PO CAPS
0.4000 mg | ORAL_CAPSULE | ORAL | Status: AC
  Administered 2023-12-24: 0.4 mg via ORAL
  Filled 2023-12-24: qty 1

## 2023-12-24 MED ORDER — ONDANSETRON 8 MG PO TBDP: ORAL_TABLET | ORAL | 0 refills | Status: DC

## 2023-12-24 MED ORDER — TAMSULOSIN HCL 0.4 MG PO CAPS: 0.4000 mg | ORAL_CAPSULE | Freq: Every day | ORAL | 0 refills | Status: AC

## 2023-12-24 MED ORDER — MAGNESIUM SULFATE 2 GM/50ML IV SOLN
2.0000 g | Freq: Once | INTRAVENOUS | Status: AC
  Administered 2023-12-24: 2 g via INTRAVENOUS
  Filled 2023-12-24: qty 50

## 2023-12-24 MED ORDER — KETOROLAC TROMETHAMINE 30 MG/ML IJ SOLN
30.0000 mg | Freq: Once | INTRAMUSCULAR | Status: AC
  Administered 2023-12-24: 30 mg via INTRAVENOUS
  Filled 2023-12-24: qty 1

## 2023-12-24 MED ORDER — ONDANSETRON HCL 4 MG/2ML IJ SOLN
4.0000 mg | Freq: Once | INTRAMUSCULAR | Status: AC
  Administered 2023-12-24: 4 mg via INTRAVENOUS
  Filled 2023-12-24: qty 2

## 2023-12-24 MED ORDER — DICLOFENAC SODIUM ER 100 MG PO TB24: 100.0000 mg | ORAL_TABLET | Freq: Every day | ORAL | 0 refills | Status: DC

## 2023-12-24 NOTE — ED Provider Notes (Signed)
[place] EMERGENCY DEPARTMENT AT [facility] Provider Note   CSN: [id number] Arrival date & time: 12/23/23  2312     History  Chief Complaint  Patient presents with   Flank Pain    [name] is a 73 y.o. male.  The history is provided by the patient.  Flank Pain This is a new problem. The current episode started 6 to 12 hours ago. The problem occurs constantly. The problem has not changed since onset.Pertinent negatives include no chest pain. Nothing aggravates the symptoms. Nothing relieves the symptoms. He has tried nothing for the symptoms. The treatment provided no relief.  Patient presents with 1-2 weeks of difficulty urinating, difficulty starting stream. Some L flank discomfort, which worsened tonight. No emesis.  He is concerned it is his prostate.       Past Medical History:  Diagnosis Date   GERD (gastroesophageal reflux disease)    History of adenomatous polyp of colon    2004  hyperplastic and tubular adenoma/  2012 hyperplastic   History of aspiration pneumonitis    03/ 2015   HSV-1 (herpes simplex virus 1) infection    chronic fever blister's   Hyperlipidemia    Phimosis    Snores    wears snore night gaurd /  per pt no sleep study   Wears glasses      Home Medications Prior to Admission medications   Medication Sig Start Date End Date Taking? Authorizing Provider  Diclofenac Sodium CR 100 MG 24 hr tablet Take 1 tablet (100 mg total) by mouth daily. 12/24/23  Yes Branon Sabine, MD  ondansetron (ZOFRAN-ODT) 8 MG disintegrating tablet 8mg  ODT q8 hours prn nausea 12/24/23  Yes Taressa Rauh, MD  tamsulosin (FLOMAX) 0.4 MG CAPS capsule Take 1 capsule (0.4 mg total) by mouth daily. 12/24/23  Yes Rayn Shorb, MD  acetaminophen (TYLENOL) 325 MG tablet Take 1-2 tablets (325-650 mg total) by mouth every 6 (six) hours as needed for mild pain (pain score 1-3 or temp > 100.5). 08/05/22   Cammy Copa, MD  aspirin 81 MG chewable tablet Chew 1  tablet (81 mg total) by mouth 2 (two) times daily. 08/05/22   Cammy Copa, MD  cholecalciferol (VITAMIN D3) 25 MCG (1000 UNIT) tablet Take 1,000 Units by mouth daily.    [provider]  docusate sodium (COLACE) 100 MG capsule Take 1 capsule (100 mg total) by mouth 2 (two) times daily. 08/05/22   Cammy Copa, MD  famciclovir (FAMVIR) 250 MG tablet Take 1 tablet (250 mg total) by mouth 2 (two) times daily for suppression Patient taking differently: Take 250 mg by mouth daily. 08/28/21     famciclovir (FAMVIR) 250 MG tablet Take 1 tablet (250 mg total) by mouth 2 (two) times daily for suppression 02/27/22     Melatonin 10 MG CAPS Take 10 mg by mouth at bedtime as needed (sleep).    [provider]  methocarbamol (ROBAXIN) 500 MG tablet Take 1 tablet (500 mg total) by mouth every 6 (six) hours as needed for muscle spasms. 09/09/22   Cammy Copa, MD  Testosterone 1.62 % GEL Apply 2 Pumps topically daily. 05/17/23     Testosterone 20.25 MG/ACT (1.62%) GEL Apply 2 Pumps topically onto the skin daily. 08/18/22         Allergies    Codeine    Review of Systems   Review of Systems  Constitutional:  Negative for fever.  Eyes:  Negative for redness.  Respiratory:  Negative for wheezing and stridor.   Cardiovascular:  Negative for chest pain.  Gastrointestinal:  Negative for vomiting.  Genitourinary:  Positive for difficulty urinating and flank pain.  All other systems reviewed and are negative.   Physical Exam Updated Vital Signs BP (!) 157/95   Pulse 66   Temp 98.2 F (36.8 C) (Oral)   Resp 18   SpO2 97%  Physical Exam Vitals and nursing note reviewed.  Constitutional:      General: He is not in acute distress.    Appearance: He is well-developed. He is not diaphoretic.  HENT:     Head: Normocephalic and atraumatic.     Nose: Nose normal.  Eyes:     Conjunctiva/sclera: Conjunctivae normal.     Pupils: Pupils are equal, round, and reactive to  light.  Cardiovascular:     Rate and Rhythm: Normal rate and regular rhythm.     Pulses: Normal pulses.     Heart sounds: Normal heart sounds.  Pulmonary:     Effort: Pulmonary effort is normal.     Breath sounds: Normal breath sounds. No wheezing or rales.  Abdominal:     General: Bowel sounds are normal.     Palpations: Abdomen is soft.     Tenderness: There is no abdominal tenderness. There is no guarding or rebound.  Musculoskeletal:        General: Normal range of motion.     Cervical back: Normal range of motion and neck supple.  Skin:    General: Skin is warm and dry.     Capillary Refill: Capillary refill takes less than 2 seconds.  Neurological:     General: No focal deficit present.     Mental Status: He is alert and oriented to person, place, and time.     Deep Tendon Reflexes: Reflexes normal.  Psychiatric:        Mood and Affect: Mood normal.        Behavior: Behavior normal.     ED Results / Procedures / Treatments   Labs (all labs ordered are listed, but only abnormal results are displayed) Results for orders placed or performed during the hospital encounter of 12/24/23  Urinalysis, Routine w reflex microscopic -Urine, Clean Catch   Collection Time: 12/23/23 11:25 PM  Result Value Ref Range   Color, Urine YELLOW YELLOW   APPearance CLEAR CLEAR   Specific Gravity, Urine 1.026 1.005 - 1.030   pH 5.5 5.0 - 8.0   Glucose, UA NEGATIVE NEGATIVE mg/dL   Hgb urine dipstick MODERATE (A) NEGATIVE   Bilirubin Urine NEGATIVE NEGATIVE   Ketones, ur NEGATIVE NEGATIVE mg/dL   Protein, ur NEGATIVE NEGATIVE mg/dL   Nitrite NEGATIVE NEGATIVE   Leukocytes,Ua NEGATIVE NEGATIVE   RBC / HPF >50 0 - 5 RBC/hpf   WBC, UA 0-5 0 - 5 WBC/hpf   Bacteria, UA RARE (A) NONE SEEN   Squamous Epithelial / HPF 0-5 0 - 5 /HPF   Mucus PRESENT    Ca Oxalate Crys, UA PRESENT   CBC with Differential   Collection Time: 12/23/23 11:27 PM  Result Value Ref Range   WBC 7.9 4.0 - 10.5 K/uL    RBC 5.04 4.22 - 5.81 MIL/uL   Hemoglobin 16.7 13.0 - 17.0 g/dL   HCT 16.1 09.6 - 04.5 %   MCV 95.0 80.0 - 100.0 fL   MCH 33.1 26.0 - 34.0 pg   MCHC 34.9 30.0 - 36.0 g/dL   RDW 40.9 81.1 -  15.5 %   Platelets 210 150 - 400 K/uL   nRBC 0.0 0.0 - 0.2 %   Neutrophils Relative % 53 %   Neutro Abs 4.2 1.7 - 7.7 K/uL   Lymphocytes Relative 32 %   Lymphs Abs 2.5 0.7 - 4.0 K/uL   Monocytes Relative 10 %   Monocytes Absolute 0.8 0.1 - 1.0 K/uL   Eosinophils Relative 3 %   Eosinophils Absolute 0.3 0.0 - 0.5 K/uL   Basophils Relative 1 %   Basophils Absolute 0.0 0.0 - 0.1 K/uL   Immature Granulocytes 1 %   Abs Immature Granulocytes 0.05 0.00 - 0.07 K/uL  Basic metabolic panel   Collection Time: 12/23/23 11:27 PM  Result Value Ref Range   Sodium 138 135 - 145 mmol/L   Potassium 4.4 3.5 - 5.1 mmol/L   Chloride 103 98 - 111 mmol/L   CO2 26 22 - 32 mmol/L   Glucose, Bld 103 (H) 70 - 99 mg/dL   BUN 21 8 - 23 mg/dL   Creatinine, Ser 9.52 0.61 - 1.24 mg/dL   Calcium 84.1 8.9 - 32.4 mg/dL   GFR, Estimated >40 >10 mL/min   Anion gap 9 5 - 15   CT Renal Stone Study Result Date: 12/24/2023 CLINICAL DATA:  Left flank pain onset 1 hour ago. EXAM: CT ABDOMEN AND PELVIS WITHOUT CONTRAST TECHNIQUE: Multidetector CT imaging of the abdomen and pelvis was performed following the standard protocol without IV contrast. RADIATION DOSE REDUCTION: This exam was performed according to the departmental dose-optimization program which includes automated exposure control, adjustment of the mA and/or kV according to patient size and/or use of iterative reconstruction technique. COMPARISON:  None Available. FINDINGS: Lower chest: No abnormality. Hepatobiliary: Liver 17 cm length and mildly steatotic. No mass is seen without contrast. Gallbladder and bile ducts are unremarkable. Pancreas: There is mild fatty infiltration. No masses seen without contrast. Spleen: Mildly prominent, 14 cm AP. No mass is seen without contrast.  Adrenals/Urinary Tract: There is no adrenal mass. There is a 4.2 cm Bosniak 1 cyst in the medial upper right kidney, Hounsfield density is 5. There is a Bosniak 1 cyst in the lower pole of the left kidney measuring 2.2 cm, Hounsfield density is -7. No follow-up imaging is recommended. No other contour deforming abnormality of the unenhanced kidneys is seen. There are no intrarenal stones on either side. On the left there is mild hydroureteronephrosis. There is a 2 mm stone in the pelvic left ureter at the level of S3. More distally there is a 3 mm left ureteral stone 1.3 cm proximal to the UVJ. The bilateral ureters are otherwise clear. The bladder is contracted and not well seen but there are no inflammatory changes around it. Stomach/Bowel: No dilatation or wall thickening including the appendix. Mild-to-moderate fecal stasis ascending colon, proximal transverse segment. 3.2 cm submucosal lipoma is present in the proximal descending colon. There is advanced sigmoid diverticulosis without evidence of acute diverticulitis. Vascular/Lymphatic: The hepatic portal vein measures prominent at 16 mm. The aorta is tortuous but without visible plaques. There are mild calcific plaques in the common iliac arteries. There is no AAA. Multiple pelvic phleboliths. No lymphadenopathy is seen. Reproductive: Enlarged prostate, 5.9 cm transverse, encroaches on the dorsal bladder base. Other: Umbilical and inguinal fat hernias. No incarcerated hernia. No free fluid, free hemorrhage or free air. Musculoskeletal: No acute or significant osseous findings. IMPRESSION: 1. 2 mm stone in the pelvic left ureter at the level of S3, and a  3 mm stone in the distal left ureter 1.3 cm proximal to the UVJ. Mild left hydroureteronephrosis. 2. No caliceal intrarenal stones bilaterally. 3. Constipation and diverticulosis. 4. Prostatomegaly. 5. Umbilical and inguinal fat hernias. 6. Mild hepatic steatosis.  Mild splenomegaly. 7. Mildly prominent  hepatic portal vein at 16 mm. 8. Iliac atherosclerosis. 9. 3.2 cm submucosal lipoma in the proximal descending colon. 10. Bosniak 1 cysts in both kidneys. Electronically Signed   By: Almira Bar M.D.   On: 12/24/2023 00:03     Radiology CT Renal Stone Study Result Date: 12/24/2023 CLINICAL DATA:  Left flank pain onset 1 hour ago. EXAM: CT ABDOMEN AND PELVIS WITHOUT CONTRAST TECHNIQUE: Multidetector CT imaging of the abdomen and pelvis was performed following the standard protocol without IV contrast. RADIATION DOSE REDUCTION: This exam was performed according to the departmental dose-optimization program which includes automated exposure control, adjustment of the mA and/or kV according to patient size and/or use of iterative reconstruction technique. COMPARISON:  None Available. FINDINGS: Lower chest: No abnormality. Hepatobiliary: Liver 17 cm length and mildly steatotic. No mass is seen without contrast. Gallbladder and bile ducts are unremarkable. Pancreas: There is mild fatty infiltration. No masses seen without contrast. Spleen: Mildly prominent, 14 cm AP. No mass is seen without contrast. Adrenals/Urinary Tract: There is no adrenal mass. There is a 4.2 cm Bosniak 1 cyst in the medial upper right kidney, Hounsfield density is 5. There is a Bosniak 1 cyst in the lower pole of the left kidney measuring 2.2 cm, Hounsfield density is -7. No follow-up imaging is recommended. No other contour deforming abnormality of the unenhanced kidneys is seen. There are no intrarenal stones on either side. On the left there is mild hydroureteronephrosis. There is a 2 mm stone in the pelvic left ureter at the level of S3. More distally there is a 3 mm left ureteral stone 1.3 cm proximal to the UVJ. The bilateral ureters are otherwise clear. The bladder is contracted and not well seen but there are no inflammatory changes around it. Stomach/Bowel: No dilatation or wall thickening including the appendix. Mild-to-moderate  fecal stasis ascending colon, proximal transverse segment. 3.2 cm submucosal lipoma is present in the proximal descending colon. There is advanced sigmoid diverticulosis without evidence of acute diverticulitis. Vascular/Lymphatic: The hepatic portal vein measures prominent at 16 mm. The aorta is tortuous but without visible plaques. There are mild calcific plaques in the common iliac arteries. There is no AAA. Multiple pelvic phleboliths. No lymphadenopathy is seen. Reproductive: Enlarged prostate, 5.9 cm transverse, encroaches on the dorsal bladder base. Other: Umbilical and inguinal fat hernias. No incarcerated hernia. No free fluid, free hemorrhage or free air. Musculoskeletal: No acute or significant osseous findings. IMPRESSION: 1. 2 mm stone in the pelvic left ureter at the level of S3, and a 3 mm stone in the distal left ureter 1.3 cm proximal to the UVJ. Mild left hydroureteronephrosis. 2. No caliceal intrarenal stones bilaterally. 3. Constipation and diverticulosis. 4. Prostatomegaly. 5. Umbilical and inguinal fat hernias. 6. Mild hepatic steatosis.  Mild splenomegaly. 7. Mildly prominent hepatic portal vein at 16 mm. 8. Iliac atherosclerosis. 9. 3.2 cm submucosal lipoma in the proximal descending colon. 10. Bosniak 1 cysts in both kidneys. Electronically Signed   By: Almira Bar M.D.   On: 12/24/2023 00:03    Procedures Procedures    Medications Ordered in ED Medications  ketorolac (TORADOL) 30 MG/ML injection 30 mg (30 mg Intravenous Given 12/24/23 0036)  magnesium sulfate IVPB 2 g 50  mL (0 g Intravenous Stopped 12/24/23 0127)  tamsulosin (FLOMAX) capsule 0.4 mg (0.4 mg Oral Given 12/24/23 0037)  ondansetron (ZOFRAN) injection 4 mg (4 mg Intravenous Given 12/24/23 0037)    ED Course/ Medical Decision Making/ A&P                                 Medical Decision Making Patient with hesitancy and L flank pain   Problems Addressed: Kidney stones: acute illness or injury    Details:  Strain all urine, take medication as prescribed and follow up with urology for ongoing care  Urinary hesitancy:    Details: Flomax initiated for stones may help with this if stone or prostate related.  Follow up with urology.  Patient verbalizes understanding   Amount and/or Complexity of Data Reviewed External Data Reviewed: notes.    Details: Previous notes reviewed  Labs: ordered.    Details: Normal white count 7.9, normal hemoglobin 16.7, normal platelets.  Normal sodium 138, normal potassium 4.4, normal creatinine 0.98, urine has hemoglobin.   Radiology: ordered.  Risk Prescription drug management. Risk Details: Well appearing, pain is a 3 at time of exam. Informed there are 2 stones in the ureter.   Treated in the ED feeling better.  Strainer provided.  RX for pain medication, nausea medication and flomax.  Strain all urine.  Call for close follow up with urology.      Final Clinical Impression(s) / ED Diagnoses Final diagnoses:  Kidney stones  Urinary hesitancy   I have reviewed the triage vital signs and the nursing notes. Pertinent labs & imaging results that were available during my care of the patient were reviewed by me and considered in my medical decision making (see chart for details). After history, exam, and medical workup I feel the patient has been appropriately medically screened and is safe for discharge home. Pertinent diagnoses were discussed with the patient. Patient was given return precautions.  Rx / DC Orders ED Discharge Orders          Ordered    tamsulosin (FLOMAX) 0.4 MG CAPS capsule  Daily        12/24/23 0143    ondansetron (ZOFRAN-ODT) 8 MG disintegrating tablet        12/24/23 0143    Diclofenac Sodium CR 100 MG 24 hr tablet  Daily        12/24/23 0143              Geral Tuch, MD 12/24/23 [phone]

## 2023-12-28 DIAGNOSIS — N201 Calculus of ureter: Secondary | ICD-10-CM | POA: Diagnosis not present

## 2024-01-03 ENCOUNTER — Other Ambulatory Visit: Payer: Self-pay | Admitting: Urology

## 2024-01-03 ENCOUNTER — Encounter (HOSPITAL_COMMUNITY): Payer: Self-pay | Admitting: Urology

## 2024-01-03 ENCOUNTER — Other Ambulatory Visit: Payer: Self-pay

## 2024-01-03 DIAGNOSIS — N201 Calculus of ureter: Secondary | ICD-10-CM | POA: Diagnosis not present

## 2024-01-03 NOTE — Progress Notes (Signed)
 For Anesthesia: PCP - [facility]  Cardiologist - N/A  Chest x-ray - greater than 1 year in [facility] EKG - N/A Stress Test - greater than 2 years ECHO - N/A Cardiac Cath - N/A Pacemaker/ICD device last checked: N/A Pacemaker orders received: N/A Device Rep notified: N/A  Spinal Cord Stimulator: N/A  Sleep Study - N/A CPAP - N/A  Fasting Blood Sugar - N/A Checks Blood Sugar _N/A____ times a day Date and result of last Hgb A1c-N/A   Blood Thinner Instructions: N/A Aspirin Instructions: N/A Last Dose: N/A  Activity level:  Able to exercise without chest pain and/or shortness of breath       Anesthesia review: N/A  Patient denies shortness of breath, fever, cough and chest pain during pre op phone call.   Patient verbalized understanding of instructions reviewed via telephone.

## 2024-01-03 NOTE — H&P (Signed)
 Office Visit Report     01/03/2024   --------------------------------------------------------------------------------   [name]  MRN: [id number]  DOB: 15-Mar-1951, 73 year old Male  PRIMARY CARE:  [name], [place]  PRIMARY CARE FAX:  [phone]  REFERRING:  [name]. Melvin Shi, MD  PROVIDER:  Berniece Stevens, M.D.  TREATING:  [name], M.D.  LOCATION:  [facility] [phone]     --------------------------------------------------------------------------------   CC: I have pain in the flank.  HPI: [name] is a 73 year-old male established patient who is here for flank pain.  The problem is on the left side. His pain started about 12/23/2023. The pain is sharp. The pain is intermittent. The pain does radiate.   Not moving< makes the pain better. Walking and twisting makes the pain worse. He was treated with the following pain medication(s): Toradol.   He has had this same pain previously. He has had kidney stones.   12/28/23:  -Retired Garment/textile technologist  -CT stone study from 12/23/2023 revealed 2 left distal ureteral stones measuring 2 to 3 mm each associated with mild left hydronephrosis. No other GU abnormalities were seen.  -One prior stone episode 25 years ago but did not require surgical intervention  -Denies fever/chills, but is experiencing waves of nausea  -Denies dysuria or gross hematuria  -UA, BMP and CBC from the ER were WNL  -He is still having intermittent episodes of LLQ pain, specifically with movement but states that p.o. Aleve is alleviating most of the pain. States that he played pickle ball yesterday with no issues. He has been straining his urine, but has not seen any stone material passed yet.   01/03/2024: The patient presents back today with recurrence of his left-sided flank pain that he describes as sharp and radiates into the left lower quadrant. He denies interval episodes of nausea/vomiting, fever/chills, dysuria or  gross hematuria.     ALLERGIES: Codeine Derivatives    MEDICATIONS: Ketorolac Tromethamine 10 mg tablet 1 tablet PO Q 6 H PRN  Tamsulosin Hcl 0.4 mg capsule  Testosterone  Vancyclovir  Voltaren Arthritis Pain     GU PSH: Non-Newborn Circumcision - 2017       PSH Notes: Inguinal Hernia Repair, Knee Surgery, ACL reconstruction left knee, cyst behind right ear removed (2016), Right inguinal Hernia (1986)   NON-GU PSH: None   GU PMH: Flank Pain - 12/28/2023 Ureteral calculus - 12/28/2023 Phimosis, I think the patient is having issues with his foreskin and the frenulum tether. The area is inflamed and causing a tighter phimtotic band. I think the patient would benefit from a circumcision revision. - 2017, - 2017, - 2017 Penile congenital disorder - 2017 Elevated PSA, Elevated prostate specific antigen (PSA) - 2014      PMH Notes:  1898-11-02 00:00:00 - Note: Normal Routine History And Physical Adult   NON-GU PMH: Other specified postprocedural states - 2017 GERD    FAMILY HISTORY: 2 daughters - Daughter 1 son - Son Cervical Cancer - Sister Dementia - Father Family Health Status Number - Runs In Family Kidney Stones - Mother liver cancer - Mother ovarian cancer - Mother prostate cancer in father - Runs in Family   SOCIAL HISTORY: Marital Status: Married Preferred Language: English; Race: White Current Smoking Status: Patient has never smoked.  Does not use smokeless tobacco. Does drink.  Does not use drugs. Drinks 1 caffeinated drink per day. 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    REVIEW OF SYSTEMS:  GU Review Male:   Patient reports frequent urination. Patient denies hard to postpone urination, burning/ pain with urination, get up at night to urinate, leakage of urine, stream starts and stops, trouble starting your stream, have to strain to urinate , erection problems, and penile pain.  Gastrointestinal (Upper):   Patient reports nausea.  Patient denies indigestion/ heartburn and vomiting.  Gastrointestinal (Lower):   Patient denies diarrhea and constipation.  Constitutional:   Patient denies fever, night sweats, weight loss, and fatigue.  Skin:   Patient denies skin rash/ lesion and itching.  Eyes:   Patient denies blurred vision and double vision.  Ears/ Nose/ Throat:   Patient denies sore throat and sinus problems.  Hematologic/Lymphatic:   Patient denies swollen glands and easy bruising.  Cardiovascular:   Patient denies leg swelling and chest pains.  Respiratory:   Patient denies cough and shortness of breath.  Endocrine:   Patient denies excessive thirst.  Musculoskeletal:   Patient reports back pain. Patient denies joint pain.  Neurological:   Patient denies headaches and dizziness.  Psychologic:   Patient denies depression and anxiety.   VITAL SIGNS:      01/03/2024 01:26 PM  Weight 225 lb / 102.06 kg  BP 126/83 mmHg  Pulse 66 /min  Temperature 97.7 F / 36.5 C   Complexity of Data:  Records Review:   Previous Patient Records  X-Ray Review: C.T. Abdomen/Pelvis: Reviewed Report.     06/21/09 05/01/09 01/09/09 11/01/08  PSA  Total PSA 0.80  1.01  0.74  2.42     PROCEDURES: None   ASSESSMENT:      ICD-10 Details  1 GU:   Ureteral calculus - N20.1 Chronic, Worsening  2   Flank Pain - R10.84 Chronic, Worsening     PLAN:           Schedule Return Visit/Planned Activity: Next Available Appointment - Schedule Surgery          Document Letter(s):  Created for Patient: Clinical Summary         Notes:   The risks, benefits and alternatives of cystoscopy with LEFT ureteroscopy, laser lithotripsy and ureteral stent placement was discussed the patient. Risks included, but are not limited to: bleeding, urinary tract infection, ureteral injury/avulsion, ureteral stricture formation, retained stone fragments, the possibility that multiple surgeries may be required to treat the stone(s), MI, stroke, PE and the  inherent risks of general anesthesia. The patient voices understanding and wishes to proceed.

## 2024-01-04 ENCOUNTER — Ambulatory Visit (HOSPITAL_COMMUNITY): Payer: Self-pay | Admitting: Anesthesiology

## 2024-01-04 ENCOUNTER — Ambulatory Visit (HOSPITAL_COMMUNITY)

## 2024-01-04 ENCOUNTER — Ambulatory Visit (HOSPITAL_BASED_OUTPATIENT_CLINIC_OR_DEPARTMENT_OTHER): Payer: Self-pay | Admitting: Anesthesiology

## 2024-01-04 ENCOUNTER — Ambulatory Visit (HOSPITAL_COMMUNITY)
Admission: RE | Admit: 2024-01-04 | Discharge: 2024-01-04 | Disposition: A | Discharge status: Home / Self Care | Attending: Urology | Admitting: Urology

## 2024-01-04 ENCOUNTER — Ambulatory Visit: Payer: Self-pay

## 2024-01-04 ENCOUNTER — Encounter (HOSPITAL_COMMUNITY): Payer: Self-pay | Admitting: Urology

## 2024-01-04 ENCOUNTER — Encounter (HOSPITAL_COMMUNITY)
Admission: RE | Disposition: A | Discharge status: Home / Self Care | Payer: Self-pay | Source: Home / Self Care | Attending: Urology

## 2024-01-04 ENCOUNTER — Other Ambulatory Visit: Payer: Self-pay

## 2024-01-04 DIAGNOSIS — N201 Calculus of ureter: Secondary | ICD-10-CM | POA: Diagnosis not present

## 2024-01-04 DIAGNOSIS — K219 Gastro-esophageal reflux disease without esophagitis: Secondary | ICD-10-CM | POA: Diagnosis not present

## 2024-01-04 DIAGNOSIS — Z87442 Personal history of urinary calculi: Secondary | ICD-10-CM | POA: Insufficient documentation

## 2024-01-04 DIAGNOSIS — N132 Hydronephrosis with renal and ureteral calculous obstruction: Secondary | ICD-10-CM | POA: Insufficient documentation

## 2024-01-04 HISTORY — DX: Personal history of urinary calculi: Z87.442

## 2024-01-04 HISTORY — PX: CYSTOSCOPY/URETEROSCOPY/HOLMIUM LASER/STENT PLACEMENT: SHX6546

## 2024-01-04 SURGERY — CYSTOSCOPY/URETEROSCOPY/HOLMIUM LASER/STENT PLACEMENT
Anesthesia: General | Laterality: Left

## 2024-01-04 MED ORDER — OXYCODONE-ACETAMINOPHEN 5-325 MG PO TABS: 1.0000 | ORAL_TABLET | ORAL | 0 refills | Status: DC | PRN

## 2024-01-04 MED ORDER — OXYBUTYNIN CHLORIDE 5 MG PO TABS: 5.0000 mg | ORAL_TABLET | Freq: Three times a day (TID) | ORAL | 1 refills | Status: DC | PRN

## 2024-01-04 MED ORDER — PHENAZOPYRIDINE HCL 200 MG PO TABS: 200.0000 mg | ORAL_TABLET | Freq: Three times a day (TID) | ORAL | 0 refills | Status: DC | PRN

## 2024-01-04 MED ORDER — KETOROLAC TROMETHAMINE 30 MG/ML IJ SOLN
INTRAMUSCULAR | Status: DC | PRN
  Administered 2024-01-04: 30 mg via INTRAVENOUS

## 2024-01-04 MED ORDER — ORAL CARE MOUTH RINSE: 15.0000 mL | Freq: Once | OROMUCOSAL | Status: AC

## 2024-01-04 MED ORDER — LIDOCAINE HCL (CARDIAC) PF 100 MG/5ML IV SOSY
PREFILLED_SYRINGE | INTRAVENOUS | Status: DC | PRN
  Administered 2024-01-04: 60 mg via INTRAVENOUS

## 2024-01-04 MED ORDER — CHLORHEXIDINE GLUCONATE 0.12 % MT SOLN
15.0000 mL | Freq: Once | OROMUCOSAL | Status: AC
  Administered 2024-01-04: 15 mL via OROMUCOSAL

## 2024-01-04 MED ORDER — IOHEXOL 300 MG/ML  SOLN
INTRAMUSCULAR | Status: DC | PRN
  Administered 2024-01-04: 8 mL via URETHRAL

## 2024-01-04 MED ORDER — PROPOFOL 10 MG/ML IV BOLUS
INTRAVENOUS | Status: DC | PRN
  Administered 2024-01-04: 200 mg via INTRAVENOUS

## 2024-01-04 MED ORDER — CEPHALEXIN 500 MG PO CAPS: 500.0000 mg | ORAL_CAPSULE | Freq: Two times a day (BID) | ORAL | 0 refills | Status: AC

## 2024-01-04 MED ORDER — EPHEDRINE 5 MG/ML INJ
INTRAVENOUS | Status: AC
  Filled 2024-01-04: qty 5

## 2024-01-04 MED ORDER — KETOROLAC TROMETHAMINE 30 MG/ML IJ SOLN
INTRAMUSCULAR | Status: AC
Start: 2024-01-04 — End: ?
  Filled 2024-01-04: qty 1

## 2024-01-04 MED ORDER — MIDAZOLAM HCL 2 MG/2ML IJ SOLN
INTRAMUSCULAR | Status: AC
  Filled 2024-01-04: qty 2

## 2024-01-04 MED ORDER — DEXAMETHASONE SODIUM PHOSPHATE 10 MG/ML IJ SOLN
INTRAMUSCULAR | Status: DC | PRN
  Administered 2024-01-04: 6 mg via INTRAVENOUS

## 2024-01-04 MED ORDER — ONDANSETRON HCL 4 MG/2ML IJ SOLN
INTRAMUSCULAR | Status: DC | PRN
  Administered 2024-01-04: 4 mg via INTRAVENOUS

## 2024-01-04 MED ORDER — LACTATED RINGERS IV SOLN: INTRAVENOUS | Status: DC

## 2024-01-04 MED ORDER — EPHEDRINE SULFATE (PRESSORS) 50 MG/ML IJ SOLN
INTRAMUSCULAR | Status: DC | PRN
  Administered 2024-01-04: 5 mg via INTRAVENOUS
  Administered 2024-01-04: 7.5 mg via INTRAVENOUS
  Administered 2024-01-04: 5 mg via INTRAVENOUS
  Administered 2024-01-04: 7.5 mg via INTRAVENOUS

## 2024-01-04 MED ORDER — SODIUM CHLORIDE 0.9 % IR SOLN
Status: DC | PRN
  Administered 2024-01-04: 3000 mL via INTRAVESICAL

## 2024-01-04 MED ORDER — DEXAMETHASONE SODIUM PHOSPHATE 10 MG/ML IJ SOLN
INTRAMUSCULAR | Status: AC
  Filled 2024-01-04: qty 1

## 2024-01-04 MED ORDER — LIDOCAINE HCL (PF) 2 % IJ SOLN
INTRAMUSCULAR | Status: AC
  Filled 2024-01-04: qty 5

## 2024-01-04 MED ORDER — CEFAZOLIN SODIUM-DEXTROSE 2-4 GM/100ML-% IV SOLN
2.0000 g | INTRAVENOUS | Status: AC
  Administered 2024-01-04: 2 g via INTRAVENOUS
  Filled 2024-01-04: qty 100

## 2024-01-04 MED ORDER — FENTANYL CITRATE (PF) 100 MCG/2ML IJ SOLN
INTRAMUSCULAR | Status: AC
Start: 2024-01-04 — End: ?
  Filled 2024-01-04: qty 2

## 2024-01-04 MED ORDER — FENTANYL CITRATE (PF) 100 MCG/2ML IJ SOLN
INTRAMUSCULAR | Status: DC | PRN
  Administered 2024-01-04: 50 ug via INTRAVENOUS
  Administered 2024-01-04 (×2): 25 ug via INTRAVENOUS

## 2024-01-04 MED ORDER — MIDAZOLAM HCL 5 MG/5ML IJ SOLN
INTRAMUSCULAR | Status: DC | PRN
  Administered 2024-01-04: 1 mg via INTRAVENOUS

## 2024-01-04 MED ORDER — ONDANSETRON HCL 4 MG/2ML IJ SOLN
INTRAMUSCULAR | Status: AC
  Filled 2024-01-04: qty 2

## 2024-01-04 SURGICAL SUPPLY — 18 items
BAG URO CATCHER STRL LF (MISCELLANEOUS) ×2 IMPLANT
BASKET ZERO TIP NITINOL 2.4FR (BASKET) IMPLANT
CATH URETL OPEN 5X70 (CATHETERS) ×2 IMPLANT
CLOTH BEACON ORANGE TIMEOUT ST (SAFETY) ×2 IMPLANT
EXTRACTOR STONE NITINOL NGAGE (UROLOGICAL SUPPLIES) IMPLANT
FIBER LASER MOSES 200 DFL (Laser) IMPLANT
FIBER LASER MOSES 365 DFL (Laser) IMPLANT
GLOVE SURG LX STRL 7.5 STRW (GLOVE) ×2 IMPLANT
GOWN STRL SURGICAL XL XLNG (GOWN DISPOSABLE) ×2 IMPLANT
GUIDEWIRE STR DUAL SENSOR (WIRE) IMPLANT
GUIDEWIRE ZIPWRE .038 STRAIGHT (WIRE) ×2 IMPLANT
KIT TURNOVER KIT A (KITS) IMPLANT
MANIFOLD NEPTUNE II (INSTRUMENTS) ×2 IMPLANT
PACK CYSTO (CUSTOM PROCEDURE TRAY) ×2 IMPLANT
SHEATH NAVIGATOR HD 11/13X36 (SHEATH) IMPLANT
STENT URET 6FRX26 CONTOUR (STENTS) IMPLANT
TUBING CONNECTING 10 (TUBING) ×2 IMPLANT
TUBING UROLOGY SET (TUBING) ×2 IMPLANT

## 2024-01-04 NOTE — Anesthesia Postprocedure Evaluation (Signed)
 Anesthesia Post Note  Patient: [name]  Procedure(s) Performed: CYSTOSCOPY/LEFT URETEROSCOPY/RETROGRADE PYELOGRAM/HOLMIUM LASER/STENT PLACEMENT (Left)     Patient location during evaluation: PACU Anesthesia Type: General Level of consciousness: awake and alert Pain management: pain level controlled Vital Signs Assessment: post-procedure vital signs reviewed and stable Respiratory status: spontaneous breathing, nonlabored ventilation, respiratory function stable and patient connected to nasal cannula oxygen Cardiovascular status: blood pressure returned to baseline and stable Postop Assessment: no apparent nausea or vomiting Anesthetic complications: no  No notable events documented.  Last Vitals:  Vitals:   01/04/24 1200 01/04/24 1230  BP: (!) 134/94   Pulse: 66   Resp: 14 15  Temp:  36.4 C  SpO2: 98%     Last Pain:  Vitals:   01/04/24 1230  TempSrc:   PainSc: 0-No pain                 Kennieth Rad

## 2024-01-04 NOTE — Op Note (Signed)
 Operative Note  Preoperative diagnosis:  1.  Two 2 mm left UVJ stones  Postoperative diagnosis: 1.  Two 2 mm left UVJ stones  Procedure(s): 1.  Cystoscopy with left ureteroscopy, holmium laser lithotripsy and left JJ stent placement  Surgeon: Rhoderick Moody, MD  Assistants:  None  Anesthesia:  General  Complications:  None  EBL: Less than 5 mL  Specimens: 1.  Left ureteral stone fragments  Drains/Catheters: 1.  Left 6 French, 24 cm JJ stent without tether  Intraoperative findings:   Slightly stenotic left ureteral orifice Obstructing left distal ureteral calculi  Indication:  [name] is a 73 y.o. male with left renal colic secondary to 2 obstructing left distal ureteral calculi.  He has been consented for the above procedures, voices understanding and wishes to proceed.  Description of procedure:  After informed consent was obtained, the patient was brought to the operating room and general LMA anesthesia was administered. The patient was then placed in the dorsolithotomy position and prepped and draped in the usual sterile fashion. A timeout was performed. A 23 French rigid cystoscope was then inserted into the urethral meatus and advanced into the bladder under direct vision. A complete bladder survey revealed no intravesical pathology.  A 5 French ureteral catheter was then inserted into the left ureteral orifice and a retrograde pyelogram was obtained, with the findings listed above.  A Glidewire was then used to intubate the lumen of the ureteral catheter and was advanced up to the left renal pelvis, under fluoroscopic guidance.  The catheter was then removed, leaving the wire in place.  A semirigid ureteroscope was then advanced into the distal aspects of the left ureter where 2 obstructing stones were identified.  A 200 m holmium laser was then used to fracture the stone into several smaller pieces.  A 0 tip basket was then used to extract all stone fragments  from the lumen of the left ureter.  The semirigid ureteroscope was then advanced up to the left UPJ and no additional stone burden was identified.  The semirigid ureteroscope was then removed.  A rigid cystoscope was then advanced over the Glidewire and into the bladder.  A 6 French, 24 cm JJ stent was then advanced over the Glidewire and into good position with the left collecting system, confirming placement via fluoroscopy.  The patient's bladder was drained and all stone fragments were removed.  He tolerated procedure well and was transferred to the postanesthesia in stable condition.  Plan: Follow-up in 1 week for office cystoscopy and stent removal

## 2024-01-04 NOTE — Transfer of Care (Signed)
 Immediate Anesthesia Transfer of Care Note  Patient: [name]  Procedure(s) Performed: Procedure(s) with comments: CYSTOSCOPY/LEFT URETEROSCOPY/RETROGRADE PYELOGRAM/HOLMIUM LASER/STENT PLACEMENT (Left) - 45 MINUTES NEEDED  Patient Location: PACU  Anesthesia Type:General  Level of Consciousness:  sedated, patient cooperative and responds to stimulation  Airway & Oxygen Therapy:Patient Spontanous Breathing and Patient connected to face mask oxgen  Post-op Assessment:  Report given to PACU RN and Post -op Vital signs reviewed and stable  Post vital signs:  Reviewed and stable  Last Vitals:  Vitals:   01/04/24 0729  BP: (!) 146/92  Pulse: 76  Resp: 17  Temp: 36.5 C  SpO2: 98%    Complications: No apparent anesthesia complications

## 2024-01-04 NOTE — Anesthesia Procedure Notes (Signed)
 Procedure Name: Intubation Date/Time: 01/04/2024 10:45 AM  Performed by: Theodosia Quay, CRNAPre-anesthesia Checklist: Patient identified, Emergency Drugs available, Suction available, Patient being monitored and Timeout performed Patient Re-evaluated:Patient Re-evaluated prior to induction Oxygen Delivery Method: Circle system utilized Preoxygenation: Pre-oxygenation with 100% oxygen Induction Type: IV induction Ventilation: Mask ventilation without difficulty LMA: LMA inserted LMA Size: 4.0 Number of attempts: 1 Placement Confirmation: positive ETCO2, CO2 detector and breath sounds checked- equal and bilateral Tube secured with: Tape Dental Injury: Teeth and Oropharynx as per pre-operative assessment

## 2024-01-04 NOTE — Anesthesia Preprocedure Evaluation (Addendum)
 Anesthesia Evaluation  Patient identified by MRN, date of birth, ID band Patient awake    Reviewed: Allergy & Precautions, H&P , NPO status , Patient's Chart, lab work & pertinent test results  Airway Mallampati: II  TM Distance: >3 FB Neck ROM: Full    Dental no notable dental hx. (+) Teeth Intact, Dental Advisory Given   Pulmonary neg pulmonary ROS   Pulmonary exam normal breath sounds clear to auscultation       Cardiovascular Exercise Tolerance: Good negative cardio ROS  Rhythm:Regular Rate:Normal     Neuro/Psych negative neurological ROS  negative psych ROS   GI/Hepatic Neg liver ROS,GERD  ,,  Endo/Other  negative endocrine ROS    Renal/GU negative Renal ROS  negative genitourinary   Musculoskeletal   Abdominal   Peds  Hematology negative hematology ROS (+)   Anesthesia Other Findings   Reproductive/Obstetrics negative OB ROS                             Anesthesia Physical Anesthesia Plan  ASA: 2  Anesthesia Plan: General   Post-op Pain Management: Tylenol PO (pre-op)*   Induction: Intravenous  PONV Risk Score and Plan: 2 and Propofol infusion and Ondansetron  Airway Management Planned: LMA  Additional Equipment:   Intra-op Plan:   Post-operative Plan: Extubation in OR  Informed Consent: I have reviewed the patients History and Physical, chart, labs and discussed the procedure including the risks, benefits and alternatives for the proposed anesthesia with the patient or authorized representative who has indicated his/her understanding and acceptance.     Dental advisory given  Plan Discussed with: CRNA  Anesthesia Plan Comments:         Anesthesia Quick Evaluation

## 2024-01-05 ENCOUNTER — Encounter (HOSPITAL_COMMUNITY): Payer: Self-pay | Admitting: Urology

## 2024-01-05 LAB — URINE CULTURE: Culture: NO GROWTH

## 2024-01-07 DIAGNOSIS — N201 Calculus of ureter: Secondary | ICD-10-CM | POA: Diagnosis not present

## 2024-01-07 DIAGNOSIS — R509 Fever, unspecified: Secondary | ICD-10-CM | POA: Diagnosis not present

## 2024-01-07 DIAGNOSIS — R1084 Generalized abdominal pain: Secondary | ICD-10-CM | POA: Diagnosis not present

## 2024-01-11 DIAGNOSIS — N3000 Acute cystitis without hematuria: Secondary | ICD-10-CM | POA: Diagnosis not present

## 2024-01-11 DIAGNOSIS — N201 Calculus of ureter: Secondary | ICD-10-CM | POA: Diagnosis not present

## 2024-01-25 DIAGNOSIS — Z79899 Other long term (current) drug therapy: Secondary | ICD-10-CM | POA: Diagnosis not present

## 2024-01-25 DIAGNOSIS — E349 Endocrine disorder, unspecified: Secondary | ICD-10-CM | POA: Diagnosis not present

## 2024-01-25 DIAGNOSIS — E7800 Pure hypercholesterolemia, unspecified: Secondary | ICD-10-CM | POA: Diagnosis not present

## 2024-01-25 DIAGNOSIS — Z125 Encounter for screening for malignant neoplasm of prostate: Secondary | ICD-10-CM | POA: Diagnosis not present

## 2024-01-27 DIAGNOSIS — Z91018 Allergy to other foods: Secondary | ICD-10-CM | POA: Diagnosis not present

## 2024-01-27 DIAGNOSIS — E7800 Pure hypercholesterolemia, unspecified: Secondary | ICD-10-CM | POA: Diagnosis not present

## 2024-01-27 DIAGNOSIS — K760 Fatty (change of) liver, not elsewhere classified: Secondary | ICD-10-CM | POA: Diagnosis not present

## 2024-01-27 DIAGNOSIS — E349 Endocrine disorder, unspecified: Secondary | ICD-10-CM | POA: Diagnosis not present

## 2024-01-27 DIAGNOSIS — I708 Atherosclerosis of other arteries: Secondary | ICD-10-CM | POA: Diagnosis not present

## 2024-01-27 DIAGNOSIS — Z87442 Personal history of urinary calculi: Secondary | ICD-10-CM | POA: Diagnosis not present

## 2024-01-27 DIAGNOSIS — R972 Elevated prostate specific antigen [PSA]: Secondary | ICD-10-CM | POA: Diagnosis not present

## 2024-01-27 DIAGNOSIS — A6000 Herpesviral infection of urogenital system, unspecified: Secondary | ICD-10-CM | POA: Diagnosis not present

## 2024-02-21 ENCOUNTER — Other Ambulatory Visit (HOSPITAL_COMMUNITY): Payer: Self-pay

## 2024-02-23 DIAGNOSIS — N281 Cyst of kidney, acquired: Secondary | ICD-10-CM | POA: Diagnosis not present

## 2024-02-23 DIAGNOSIS — R3912 Poor urinary stream: Secondary | ICD-10-CM | POA: Diagnosis not present

## 2024-02-23 DIAGNOSIS — N201 Calculus of ureter: Secondary | ICD-10-CM | POA: Diagnosis not present

## 2024-03-03 ENCOUNTER — Ambulatory Visit (INDEPENDENT_AMBULATORY_CARE_PROVIDER_SITE_OTHER): Admitting: Surgical

## 2024-03-03 DIAGNOSIS — M1711 Unilateral primary osteoarthritis, right knee: Secondary | ICD-10-CM | POA: Diagnosis not present

## 2024-03-05 ENCOUNTER — Encounter: Payer: Self-pay | Admitting: Surgical

## 2024-03-05 MED ORDER — TRIAMCINOLONE ACETONIDE 40 MG/ML IJ SUSP
40.0000 mg | INTRAMUSCULAR | Status: AC | PRN
  Administered 2024-03-03: 40 mg via INTRA_ARTICULAR

## 2024-03-05 MED ORDER — BUPIVACAINE HCL 0.25 % IJ SOLN
4.0000 mL | INTRAMUSCULAR | Status: AC | PRN
  Administered 2024-03-03: 4 mL via INTRA_ARTICULAR

## 2024-03-05 MED ORDER — LIDOCAINE HCL 1 % IJ SOLN
5.0000 mL | INTRAMUSCULAR | Status: AC | PRN
  Administered 2024-03-03: 5 mL

## 2024-03-05 NOTE — Progress Notes (Signed)
 Office Visit Note   Patient: [name]           Date of Birth: [birth year]           MRN: [id number] Visit Date: 03/03/2024 Requested by: Practice, [facility] [address] [place],  [place] [address] PCP: Practice, [facility]  Subjective: Chief Complaint  Patient presents with   Right Knee - Pain    HPI: [name] is a 73 y.o. male who presents to the office reporting knee pain.  Has history of knee arthritis.  No new falls or injuries.  No fevers or chills.  Previous injections have provided good relief and they are here today to repeat injections.  Left knee doing well following knee replacement.  Patient plays a lot of pickleball.  Going out of town this weekend and his right knee has been bothering him more.  Would like to try right knee injection..                ROS: All systems reviewed are negative as they relate to the chief complaint within the history of present illness.  Patient denies fevers or chills.  Assessment & Plan: Visit Diagnoses: No diagnosis found.  Plan: Patient is a 73 year old male who presents for evaluation of right knee pain.  Has a history of right knee osteoarthritis.  Has had left knee replaced and this is doing very well for him.  He would like to try right knee injection today given that he is going out of town on vacation over the weekend.  Right knee injected with cortisone and patient tolerated procedure well without complication.  About 2 cc was aspirated prior to injection.  Follow-up with the office as needed.  Follow-Up Instructions: Return if symptoms worsen or fail to improve.   Orders:  No orders of the defined types were placed in this encounter.  No orders of the defined types were placed in this encounter.     Procedures: Large Joint Inj: R knee on 03/03/2024 12:43 PM Indications: diagnostic evaluation, joint swelling and pain Details: 18 G 1.5 in needle, superolateral approach  Arthrogram:  No  Medications: 5 mL lidocaine  1 %; 4 mL bupivacaine  0.25 %; 40 mg triamcinolone acetonide 40 MG/ML Outcome: tolerated well, no immediate complications Procedure, treatment alternatives, risks and benefits explained, specific risks discussed. Consent was given by the patient. Immediately prior to procedure a time out was called to verify the correct patient, procedure, equipment, support staff and site/side marked as required. Patient was prepped and draped in the usual sterile fashion.       Clinical Data: No additional findings.  Objective: Vital Signs: There were no vitals taken for this visit.  Physical Exam:  Constitutional: Patient appears well-developed HEENT:  Head: Normocephalic Eyes:EOM are normal Neck: Normal range of motion Cardiovascular: Normal rate Pulmonary/chest: Effort normal Neurologic: Patient is alert Skin: Skin is warm Psychiatric: Patient has normal mood and affect  Ortho Exam: Ortho exam demonstrates knees without cellulitis or skin changes.  Effusion present very mildly in the right knee.  No calf tenderness.  Negative Homans' sign.  No pain with hip range of motion.  Able to perform straight leg raise with both lower extremities.  Lower extremities warm and well-perfused.  Tender over the medial joint line with no lateral joint line tenderness.  Specialty Comments:  No specialty comments available.  Imaging: No results found.   PMFS History: Patient Active Problem List   Diagnosis  Date Noted   Retained orthopedic hardware    OA (osteoarthritis) of knee 08/04/2022   S/P knee replacement 08/04/2022   Infected cyst of skin 08/19/2012    Class: Chronic   Past Medical History:  Diagnosis Date   GERD (gastroesophageal reflux disease)    History of adenomatous polyp of colon    2004  hyperplastic and tubular adenoma/  2012 hyperplastic   History of aspiration pneumonitis    03/ 2015   History of kidney stones    HSV-1 (herpes simplex virus 1)  infection    chronic fever blister's   Hyperlipidemia    Phimosis    Snores    wears snore night gaurd /  per pt no sleep study   Wears glasses     Family History  Problem Relation Age of Onset   Colon cancer Neg Hx    Colon polyps Neg Hx     Past Surgical History:  Procedure Laterality Date   ARTHROSCOPIC REPAIR ACL Left 1992   CIRCUMCISION N/A 08/06/2016   Procedure: CIRCUMCISION ADULT;  Surgeon: Andrez Banker, MD;  Location[facility];  Service: Urology;  Laterality: N/A;   COLONOSCOPY  last one 05-29-2011   CYSTOSCOPY/URETEROSCOPY/HOLMIUM LASER/STENT PLACEMENT Left 01/04/2024   Procedure: CYSTOSCOPY/LEFT URETEROSCOPY/RETROGRADE PYELOGRAM/HOLMIUM LASER/STENT PLACEMENT;  Surgeon: Adelbert Homans, MD;  Location: [facility];  Service: Urology;  Laterality: Left;  45 MINUTES NEEDED   EAR CYST EXCISION  08/19/2012   Procedure: CYST REMOVAL;  Surgeon: Ammon Bales, MD;  Location: [facility];  Service: ENT;  Laterality: Left;  excision of left  infra auricular cyst    INGUINAL HERNIA REPAIR Right 1980's   KNEE ARTHROSCOPY Left 04/22/2006   TOTAL KNEE ARTHROPLASTY Left 08/04/2022   Procedure: LEFT TOTAL KNEE ARTHROPLASTY, HARDWARE REMOVAL;  Surgeon: Jasmine Mesi, MD;  Location: [facility];  Service: Orthopedics;  Laterality: Left;   Social History   Occupational History   Not on file  Tobacco Use   Smoking status: Never   Smokeless tobacco: Never  Vaping Use   Vaping status: Never Used  Substance and Sexual Activity   Alcohol use: No   Drug use: No   Sexual activity: Yes

## 2024-03-13 ENCOUNTER — Ambulatory Visit: Admitting: Orthopedic Surgery

## 2024-03-15 DIAGNOSIS — R972 Elevated prostate specific antigen [PSA]: Secondary | ICD-10-CM | POA: Diagnosis not present

## 2024-03-15 DIAGNOSIS — H53143 Visual discomfort, bilateral: Secondary | ICD-10-CM | POA: Diagnosis not present

## 2024-04-12 DIAGNOSIS — H02831 Dermatochalasis of right upper eyelid: Secondary | ICD-10-CM | POA: Diagnosis not present

## 2024-04-12 DIAGNOSIS — H2513 Age-related nuclear cataract, bilateral: Secondary | ICD-10-CM | POA: Diagnosis not present

## 2024-04-12 DIAGNOSIS — H2511 Age-related nuclear cataract, right eye: Secondary | ICD-10-CM | POA: Diagnosis not present

## 2024-04-12 DIAGNOSIS — H25013 Cortical age-related cataract, bilateral: Secondary | ICD-10-CM | POA: Diagnosis not present

## 2024-04-12 DIAGNOSIS — H18413 Arcus senilis, bilateral: Secondary | ICD-10-CM | POA: Diagnosis not present

## 2024-04-12 DIAGNOSIS — H25043 Posterior subcapsular polar age-related cataract, bilateral: Secondary | ICD-10-CM | POA: Diagnosis not present

## 2024-05-16 DIAGNOSIS — H2511 Age-related nuclear cataract, right eye: Secondary | ICD-10-CM | POA: Diagnosis not present

## 2024-05-16 DIAGNOSIS — H25042 Posterior subcapsular polar age-related cataract, left eye: Secondary | ICD-10-CM | POA: Diagnosis not present

## 2024-05-16 DIAGNOSIS — H25012 Cortical age-related cataract, left eye: Secondary | ICD-10-CM | POA: Diagnosis not present

## 2024-05-16 DIAGNOSIS — H2512 Age-related nuclear cataract, left eye: Secondary | ICD-10-CM | POA: Diagnosis not present

## 2024-05-16 DIAGNOSIS — H268 Other specified cataract: Secondary | ICD-10-CM | POA: Diagnosis not present

## 2024-05-23 DIAGNOSIS — H2512 Age-related nuclear cataract, left eye: Secondary | ICD-10-CM | POA: Diagnosis not present

## 2024-05-23 DIAGNOSIS — H268 Other specified cataract: Secondary | ICD-10-CM | POA: Diagnosis not present

## 2024-05-23 DIAGNOSIS — F419 Anxiety disorder, unspecified: Secondary | ICD-10-CM | POA: Diagnosis not present

## 2024-06-22 ENCOUNTER — Ambulatory Visit (INDEPENDENT_AMBULATORY_CARE_PROVIDER_SITE_OTHER): Admitting: Orthopedic Surgery

## 2024-06-22 DIAGNOSIS — M1711 Unilateral primary osteoarthritis, right knee: Secondary | ICD-10-CM

## 2024-06-23 ENCOUNTER — Encounter: Payer: Self-pay | Admitting: Orthopedic Surgery

## 2024-06-23 NOTE — Progress Notes (Signed)
 Office Visit Note   Patient: [name]           Date of Birth: 02/26/1951           MRN: [id number] Visit Date: 06/22/2024 Requested by: Practice, [facility] [address] [place],  [place] [address] PCP: Practice, [facility]  Subjective: Chief Complaint  Patient presents with   Right Knee - Pain    HPI: [name] is a 73 y.o. male who presents to the office reporting right knee pain.  Patient is doing well with his left total knee replacement.  Last injection was 5-25.  He has been ambulating with a knee brace.  Takes Aleve as needed..                ROS: All systems reviewed are negative as they relate to the chief complaint within the history of present illness.  Patient denies fevers or chills.  Assessment & Plan: Visit Diagnoses: No diagnosis found.  Plan: Impression is right knee pain with arthritis.  Plan is right knee injection performed today.  Continue with nonweightbearing quad strengthening exercises.  He may want to follow-up towards the end of the year before a trip for consideration of Toradol  versus cortisone injection.  Follow-Up Instructions: No follow-ups on file.   Orders:  No orders of the defined types were placed in this encounter.  No orders of the defined types were placed in this encounter.     Procedures: Large Joint Inj: R knee on 06/22/2024 8:06 PM Indications: diagnostic evaluation, joint swelling and pain Details: 18 G 1.5 in needle, superolateral approach  Arthrogram: No  Medications: 5 mL lidocaine  1 %; 4 mL bupivacaine  0.25 %; 40 mg triamcinolone  acetonide 40 MG/ML Outcome: tolerated well, no immediate complications Procedure, treatment alternatives, risks and benefits explained, specific risks discussed. Consent was given by the patient. Immediately prior to procedure a time out was called to verify the correct patient, procedure, equipment, support staff and site/side marked as required. Patient was  prepped and draped in the usual sterile fashion.       Clinical Data: No additional findings.  Objective: Vital Signs: There were no vitals taken for this visit.  Physical Exam:  Constitutional: Patient appears well-developed HEENT:  Head: Normocephalic Eyes:EOM are normal Neck: Normal range of motion Cardiovascular: Normal rate Pulmonary/chest: Effort normal Neurologic: Patient is alert Skin: Skin is warm Psychiatric: Patient has normal mood and affect  Ortho Exam: Ortho exam demonstrates palpable pedal pulses with excellent quad strength on the right and left-hand side.  Has near full extension and flexion easily to about 110-115.  Collateral crucial ligaments are stable medial greater than lateral joint line tenderness is present with no patellofemoral crepitus and only trace effusion.  Specialty Comments:  No specialty comments available.  Imaging: No results found.   PMFS History: Patient Active Problem List   Diagnosis Date Noted   Retained orthopedic hardware    OA (osteoarthritis) of knee 08/04/2022   S/P knee replacement 08/04/2022   Infected cyst of skin 08/19/2012    Class: Chronic   Past Medical History:  Diagnosis Date   GERD (gastroesophageal reflux disease)    History of adenomatous polyp of colon    2004  hyperplastic and tubular adenoma/  2012 hyperplastic   History of aspiration pneumonitis    03/ 2015   History of kidney stones    HSV-1 (herpes simplex virus 1) infection    chronic fever blister's  Hyperlipidemia    Phimosis    Snores    wears snore night gaurd /  per pt no sleep study   Wears glasses     Family History  Problem Relation Age of Onset   Colon cancer Neg Hx    Colon polyps Neg Hx     Past Surgical History:  Procedure Laterality Date   ARTHROSCOPIC REPAIR ACL Left 1992   CIRCUMCISION N/A 08/06/2016   Procedure: CIRCUMCISION ADULT;  Surgeon: Morene LELON Salines, MD;  Location: [facility];  Service:  Urology;  Laterality: N/A;   COLONOSCOPY  last one 05-29-2011   CYSTOSCOPY/URETEROSCOPY/HOLMIUM LASER/STENT PLACEMENT Left 01/04/2024   Procedure: CYSTOSCOPY/LEFT URETEROSCOPY/RETROGRADE PYELOGRAM/HOLMIUM LASER/STENT PLACEMENT;  Surgeon: Devere Lonni Righter, MD;  Location: [facility];  Service: Urology;  Laterality: Left;  45 MINUTES NEEDED   EAR CYST EXCISION  08/19/2012   Procedure: CYST REMOVAL;  Surgeon: Alm Bouche, MD;  Location: [facility];  Service: ENT;  Laterality: Left;  excision of left  infra auricular cyst    INGUINAL HERNIA REPAIR Right 1980's   KNEE ARTHROSCOPY Left 04/22/2006   TOTAL KNEE ARTHROPLASTY Left 08/04/2022   Procedure: LEFT TOTAL KNEE ARTHROPLASTY, HARDWARE REMOVAL;  Surgeon: Addie Cordella Hamilton, MD;  Location: [facility];  Service: Orthopedics;  Laterality: Left;   Social History   Occupational History   Not on file  Tobacco Use   Smoking status: Never   Smokeless tobacco: Never  Vaping Use   Vaping status: Never Used  Substance and Sexual Activity   Alcohol use: No   Drug use: No   Sexual activity: Yes

## 2024-06-24 MED ORDER — LIDOCAINE HCL 1 % IJ SOLN
5.0000 mL | INTRAMUSCULAR | Status: AC | PRN
  Administered 2024-06-22: 5 mL

## 2024-06-24 MED ORDER — BUPIVACAINE HCL 0.25 % IJ SOLN
4.0000 mL | INTRAMUSCULAR | Status: AC | PRN
  Administered 2024-06-22: 4 mL via INTRA_ARTICULAR

## 2024-06-24 MED ORDER — TRIAMCINOLONE ACETONIDE 40 MG/ML IJ SUSP
40.0000 mg | INTRAMUSCULAR | Status: AC | PRN
  Administered 2024-06-22: 40 mg via INTRA_ARTICULAR

## 2024-07-24 DIAGNOSIS — Z79899 Other long term (current) drug therapy: Secondary | ICD-10-CM | POA: Diagnosis not present

## 2024-07-24 DIAGNOSIS — E7800 Pure hypercholesterolemia, unspecified: Secondary | ICD-10-CM | POA: Diagnosis not present

## 2024-07-24 DIAGNOSIS — R972 Elevated prostate specific antigen [PSA]: Secondary | ICD-10-CM | POA: Diagnosis not present

## 2024-07-24 DIAGNOSIS — E349 Endocrine disorder, unspecified: Secondary | ICD-10-CM | POA: Diagnosis not present

## 2024-07-27 DIAGNOSIS — K760 Fatty (change of) liver, not elsewhere classified: Secondary | ICD-10-CM | POA: Diagnosis not present

## 2024-07-27 DIAGNOSIS — I708 Atherosclerosis of other arteries: Secondary | ICD-10-CM | POA: Diagnosis not present

## 2024-07-27 DIAGNOSIS — Z9189 Other specified personal risk factors, not elsewhere classified: Secondary | ICD-10-CM | POA: Diagnosis not present

## 2024-07-27 DIAGNOSIS — E349 Endocrine disorder, unspecified: Secondary | ICD-10-CM | POA: Diagnosis not present

## 2024-07-27 DIAGNOSIS — Z23 Encounter for immunization: Secondary | ICD-10-CM | POA: Diagnosis not present

## 2024-07-27 DIAGNOSIS — A6000 Herpesviral infection of urogenital system, unspecified: Secondary | ICD-10-CM | POA: Diagnosis not present

## 2024-07-27 DIAGNOSIS — E7800 Pure hypercholesterolemia, unspecified: Secondary | ICD-10-CM | POA: Diagnosis not present

## 2024-07-28 ENCOUNTER — Other Ambulatory Visit (HOSPITAL_BASED_OUTPATIENT_CLINIC_OR_DEPARTMENT_OTHER): Payer: Self-pay | Admitting: Physician Assistant

## 2024-07-28 DIAGNOSIS — Z9189 Other specified personal risk factors, not elsewhere classified: Secondary | ICD-10-CM

## 2024-08-10 ENCOUNTER — Ambulatory Visit (HOSPITAL_BASED_OUTPATIENT_CLINIC_OR_DEPARTMENT_OTHER)
Admission: RE | Admit: 2024-08-10 | Discharge: 2024-08-10 | Disposition: A | Discharge status: Home / Self Care | Payer: Self-pay | Source: Ambulatory Visit | Attending: Physician Assistant | Admitting: Physician Assistant

## 2024-08-10 DIAGNOSIS — Z9189 Other specified personal risk factors, not elsewhere classified: Secondary | ICD-10-CM | POA: Insufficient documentation

## 2024-08-23 DIAGNOSIS — H18413 Arcus senilis, bilateral: Secondary | ICD-10-CM | POA: Diagnosis not present

## 2024-08-23 DIAGNOSIS — Z961 Presence of intraocular lens: Secondary | ICD-10-CM | POA: Diagnosis not present

## 2024-08-23 DIAGNOSIS — H47323 Drusen of optic disc, bilateral: Secondary | ICD-10-CM | POA: Diagnosis not present

## 2024-08-23 DIAGNOSIS — H26492 Other secondary cataract, left eye: Secondary | ICD-10-CM | POA: Diagnosis not present

## 2024-09-04 ENCOUNTER — Encounter: Payer: Self-pay | Admitting: Radiology

## 2024-09-07 ENCOUNTER — Ambulatory Visit: Admitting: Orthopedic Surgery

## 2024-09-07 ENCOUNTER — Encounter: Payer: Self-pay | Admitting: Surgical

## 2024-09-07 ENCOUNTER — Ambulatory Visit (INDEPENDENT_AMBULATORY_CARE_PROVIDER_SITE_OTHER): Admitting: Surgical

## 2024-09-07 DIAGNOSIS — M1711 Unilateral primary osteoarthritis, right knee: Secondary | ICD-10-CM

## 2024-09-07 MED ORDER — BUPIVACAINE HCL 0.25 % IJ SOLN
4.0000 mL | INTRAMUSCULAR | Status: AC | PRN
  Administered 2024-09-07: 4 mL via INTRA_ARTICULAR

## 2024-09-07 MED ORDER — LIDOCAINE HCL 1 % IJ SOLN
5.0000 mL | INTRAMUSCULAR | Status: AC | PRN
  Administered 2024-09-07: 5 mL

## 2024-09-07 MED ORDER — TRIAMCINOLONE ACETONIDE 40 MG/ML IJ SUSP
40.0000 mg | INTRAMUSCULAR | Status: AC | PRN
  Administered 2024-09-07: 40 mg via INTRA_ARTICULAR

## 2024-09-07 NOTE — Progress Notes (Signed)
   Procedure Note  Patient: [name]             Date of Birth: Nov 11, 1950           MRN: [id number]             Visit Date: 09/07/2024  Procedures: Visit Diagnoses:  1. Arthritis of right knee     Large Joint Inj: R knee on 09/07/2024 2:43 PM Indications: diagnostic evaluation, joint swelling and pain Details: 18 G 1.5 in needle, superolateral approach  Arthrogram: No  Medications: 5 mL lidocaine  1 %; 4 mL bupivacaine  0.25 %; 40 mg triamcinolone  acetonide 40 MG/ML Outcome: tolerated well, no immediate complications Procedure, treatment alternatives, risks and benefits explained, specific risks discussed. Consent was given by the patient. Immediately prior to procedure a time out was called to verify the correct patient, procedure, equipment, support staff and site/side marked as required. Patient was prepped and draped in the usual sterile fashion.

## 2024-09-20 DIAGNOSIS — H26491 Other secondary cataract, right eye: Secondary | ICD-10-CM | POA: Diagnosis not present

## 2024-09-27 DIAGNOSIS — H2511 Age-related nuclear cataract, right eye: Secondary | ICD-10-CM | POA: Diagnosis not present

## 2024-09-27 DIAGNOSIS — Z961 Presence of intraocular lens: Secondary | ICD-10-CM | POA: Diagnosis not present

## 2024-09-27 DIAGNOSIS — Z9841 Cataract extraction status, right eye: Secondary | ICD-10-CM | POA: Diagnosis not present

## 2024-10-16 ENCOUNTER — Ambulatory Visit (INDEPENDENT_AMBULATORY_CARE_PROVIDER_SITE_OTHER)

## 2024-10-16 ENCOUNTER — Inpatient Hospital Stay
Admission: RE | Admit: 2024-10-16 | Discharge: 2024-10-16 | Payer: Self-pay | Attending: Family Medicine | Admitting: Family Medicine

## 2024-10-16 VITALS — BP 125/86 | HR 71 | Temp 97.9°F | Resp 18

## 2024-10-16 DIAGNOSIS — R051 Acute cough: Secondary | ICD-10-CM | POA: Diagnosis not present

## 2024-10-16 DIAGNOSIS — J209 Acute bronchitis, unspecified: Secondary | ICD-10-CM

## 2024-10-16 DIAGNOSIS — R059 Cough, unspecified: Secondary | ICD-10-CM | POA: Diagnosis not present

## 2024-10-16 DIAGNOSIS — E7800 Pure hypercholesterolemia, unspecified: Secondary | ICD-10-CM | POA: Insufficient documentation

## 2024-10-16 DIAGNOSIS — Z0000 Encounter for general adult medical examination without abnormal findings: Secondary | ICD-10-CM | POA: Insufficient documentation

## 2024-10-16 DIAGNOSIS — E291 Testicular hypofunction: Secondary | ICD-10-CM | POA: Insufficient documentation

## 2024-10-16 DIAGNOSIS — E789 Disorder of lipoprotein metabolism, unspecified: Secondary | ICD-10-CM | POA: Insufficient documentation

## 2024-10-16 DIAGNOSIS — N471 Phimosis: Secondary | ICD-10-CM | POA: Insufficient documentation

## 2024-10-16 DIAGNOSIS — K219 Gastro-esophageal reflux disease without esophagitis: Secondary | ICD-10-CM | POA: Insufficient documentation

## 2024-10-16 MED ORDER — PREDNISONE 20 MG PO TABS: 40.0000 mg | ORAL_TABLET | Freq: Every day | ORAL | 0 refills | Status: AC

## 2024-10-16 MED ORDER — DOXYCYCLINE HYCLATE 100 MG PO CAPS: 100.0000 mg | ORAL_CAPSULE | Freq: Two times a day (BID) | ORAL | 0 refills | Status: AC

## 2024-10-16 NOTE — ED Triage Notes (Signed)
 Pt reports productive cough x3 weeks. Denies fevers, congestion, and other associated symptoms. Notes he is coughing up white sputum. Has been using his albuterol  inhaler about twice a day due to wheezing when he lays down. At his normal baseline, he rarely uses this inhaler. No other med use. His wife has been sick with the same cough, but is starting to improve.

## 2024-10-16 NOTE — ED Provider Notes (Signed)
 [facility]    CSN: [id number] Arrival date & time: 10/16/24  1346      History   Chief Complaint Chief Complaint  Patient presents with   Cough    Had for three weeks - Entered by patient    HPI [name] is a 73 y.o. male.    Cough Associated symptoms: wheezing    Patient is here for a cough x 3 weeks.  He has had some wheezing, off/on, but mostly when he lays down.  During the day it is not as bad.  He has to sit in a recliner in order to not cough.  No fevers/chills.  He is not using any otc meds other than his inhalers.        Past Medical History:  Diagnosis Date   GERD (gastroesophageal reflux disease)    History of adenomatous polyp of colon    2004  hyperplastic and tubular adenoma/  2012 hyperplastic   History of aspiration pneumonitis    03/ 2015   History of kidney stones    HSV-1 (herpes simplex virus 1) infection    chronic fever blister's   Hyperlipidemia    Infected cyst of skin 08/19/2012   Phimosis    Snores    wears snore night gaurd /  per pt no sleep study   Wears glasses     Patient Active Problem List   Diagnosis Date Noted   Phimosis 10/16/2024   Hypogonadism in male 10/16/2024   Gastroesophageal reflux disease 10/16/2024   Disorder of lipoprotein metabolism, unspecified 10/16/2024   Disorder of lipid metabolism 10/16/2024   Pure hypercholesterolemia 10/16/2024   Routine medical exam 10/16/2024   Retained orthopedic hardware    OA (osteoarthritis) of knee 08/04/2022   S/P knee replacement 08/04/2022   Insertional Achilles tendinopathy 08/04/2019   Posterior calcaneal exostosis 08/04/2019    Past Surgical History:  Procedure Laterality Date   ARTHROSCOPIC REPAIR ACL Left 1992   CIRCUMCISION N/A 08/06/2016   Procedure: CIRCUMCISION ADULT;  Surgeon: Morene LELON Salines, MD;  Location: [facility];  Service: Urology;  Laterality: N/A;   COLONOSCOPY  last one 05-29-2011    CYSTOSCOPY/URETEROSCOPY/HOLMIUM LASER/STENT PLACEMENT Left 01/04/2024   Procedure: CYSTOSCOPY/LEFT URETEROSCOPY/RETROGRADE PYELOGRAM/HOLMIUM LASER/STENT PLACEMENT;  Surgeon: Devere Lonni Righter, MD;  Location: [facility];  Service: Urology;  Laterality: Left;  45 MINUTES NEEDED   EAR CYST EXCISION  08/19/2012   Procedure: CYST REMOVAL;  Surgeon: Alm Bouche, MD;  Location: [facility];  Service: ENT;  Laterality: Left;  excision of left  infra auricular cyst    INGUINAL HERNIA REPAIR Right 1980's   KNEE ARTHROSCOPY Left 04/22/2006   TOTAL KNEE ARTHROPLASTY Left 08/04/2022   Procedure: LEFT TOTAL KNEE ARTHROPLASTY, HARDWARE REMOVAL;  Surgeon: Addie Cordella Hamilton, MD;  Location: [facility];  Service: Orthopedics;  Laterality: Left;       Home Medications    Prior to Admission medications  Medication Sig Start Date End Date Taking? Authorizing Provider  famciclovir  (FAMVIR ) 250 MG tablet 1 tablet Orally Once a day; Duration: 30 days   Yes [provider]  tamsulosin  (FLOMAX ) 0.4 MG CAPS capsule Take 1 capsule (0.4 mg total) by mouth daily. 12/24/23  Yes Palumbo, April, MD  Testosterone  1.62 % GEL Apply 2 Pumps topically daily. 05/17/23  Yes   BESIVANCE 0.6 % SUSP Place 1 drop into the left eye 3 (three) times daily. Patient not taking: Reported on 10/16/2024 05/16/24   [provider]  Difluprednate 0.05 % EMUL  05/26/24   [provider]  famciclovir  (FAMVIR ) 250 MG tablet Take 1 tablet (250 mg total) by mouth 2 (two) times daily for suppression Patient taking differently: Take 250 mg by mouth daily. 02/27/22     gatifloxacin (ZYMAXID) 0.5 % SOLN SMARTSIG:In Eye(s) Patient not taking: Reported on 10/16/2024 05/07/24   [provider]  prednisoLONE acetate (PRED FORTE) 1 % ophthalmic suspension 1 drop 4 (four) times daily. Patient not taking: Reported on 10/16/2024 08/23/24   [provider]  PROLENSA 0.07 % SOLN Place 1 drop into the left  eye 2 (two) times daily. Patient not taking: Reported on 10/16/2024 05/16/24   [provider]  simvastatin  (ZOCOR ) 40 MG tablet 1 tablet in the evening Orally Once a day; Duration: 30 day(s) Patient not taking: Reported on 10/16/2024    [provider]    Family History Family History  Problem Relation Age of Onset   Colon cancer Neg Hx    Colon polyps Neg Hx     Social History Social History[1]   Allergies   Codeine and Alpha-gal   Review of Systems Review of Systems  Constitutional: Negative.   HENT: Negative.    Respiratory:  Positive for cough and wheezing.   Cardiovascular: Negative.   Gastrointestinal: Negative.   Musculoskeletal: Negative.   Psychiatric/Behavioral: Negative.       Physical Exam Triage Vital Signs ED Triage Vitals  Encounter Vitals Group     BP 10/16/24 1423 125/86     Girls Systolic BP Percentile --      Girls Diastolic BP Percentile --      Boys Systolic BP Percentile --      Boys Diastolic BP Percentile --      Pulse Rate 10/16/24 1423 71     Resp 10/16/24 1423 18     Temp 10/16/24 1423 97.9 F (36.6 C)     Temp Source 10/16/24 1423 Oral     SpO2 10/16/24 1423 94 %     Weight --      Height --      Head Circumference --      Peak Flow --      Pain Score 10/16/24 1421 0     Pain Loc --      Pain Education --      Exclude from Growth Chart --    No data found.  Updated Vital Signs BP 125/86 (BP Location: Left Arm)   Pulse 71   Temp 97.9 F (36.6 C) (Oral)   Resp 18   SpO2 94%   Visual Acuity Right Eye Distance:   Left Eye Distance:   Bilateral Distance:    Right Eye Near:   Left Eye Near:    Bilateral Near:     Physical Exam Constitutional:      General: He is not in acute distress.    Appearance: Normal appearance. He is normal weight. He is not ill-appearing or toxic-appearing.  Cardiovascular:     Rate and Rhythm: Normal rate and regular rhythm.  Pulmonary:     Effort: Pulmonary effort is  normal.     Breath sounds: Wheezing present.  Neurological:     General: No focal deficit present.     Mental Status: He is alert.  Psychiatric:        Mood and Affect: Mood normal.      UC Treatments / Results  Labs (all labs ordered are listed, but only  abnormal results are displayed) Labs Reviewed - No data to display  EKG   Radiology DG Chest 2 View Result Date: 10/16/2024 EXAM: 2 VIEW(S) XRAY OF THE CHEST 10/16/2024 02:46:15 PM COMPARISON: 02/19/2023 CLINICAL HISTORY: cough FINDINGS: LUNGS AND PLEURA: No focal pulmonary opacity. No pleural effusion. No pneumothorax. HEART AND MEDIASTINUM: No acute abnormality of the cardiac and mediastinal silhouettes. BONES AND SOFT TISSUES: Degenerative changes. No acute osseous abnormality. IMPRESSION: 1. No acute cardiopulmonary process to explain cough. Electronically signed by: Waddell Calk MD 10/16/2024 02:57 PM EST RP Workstation: [id number]    Procedures Procedures (including critical care time)  Medications Ordered in UC Medications - No data to display  Initial Impression / Assessment and Plan / UC Course  I have reviewed the triage vital signs and the nursing notes.  Pertinent labs & imaging results that were available during my care of the patient were reviewed by me and considered in my medical decision making (see chart for details).  Final Clinical Impressions(s) / UC Diagnoses   Final diagnoses:  Acute cough  Acute bronchitis, unspecified organism     Discharge Instructions      You were seen today for cough for several week.  Your chest xray was normal today.  However, I have sent out an oral antibiotic and steroid for your symptoms.  Return if not improving or worsening despite treatment.     ED Prescriptions     Medication Sig Dispense Auth. Provider   doxycycline  (VIBRAMYCIN ) 100 MG capsule Take 1 capsule (100 mg total) by mouth 2 (two) times daily. 20 capsule Kaydyn Chism, MD   predniSONE   (DELTASONE ) 20 MG tablet Take 2 tablets (40 mg total) by mouth daily for 5 days. 10 tablet Darral Longs, MD      PDMP not reviewed this encounter.     [1]  Social History Tobacco Use   Smoking status: Never   Smokeless tobacco: Never  Vaping Use   Vaping status: Never Used  Substance Use Topics   Alcohol use: No   Drug use: No     Darral Longs, MD 10/16/24 1502

## 2024-10-16 NOTE — Discharge Instructions (Signed)
 You were seen today for cough for several week.  Your chest xray was normal today.  However, I have sent out an oral antibiotic and steroid for your symptoms.  Return if not improving or worsening despite treatment.
